# Patient Record
Sex: Female | Born: 1960 | Race: White | Hispanic: No | Smoking: Never smoker
Health system: Southern US, Community
[De-identification: ages and names within clinical notes are randomized; demographics above are authoritative.]

## PROBLEM LIST (undated history)

## (undated) DIAGNOSIS — E669 Obesity, unspecified: Principal | ICD-10-CM

## (undated) DIAGNOSIS — J302 Other seasonal allergic rhinitis: Secondary | ICD-10-CM

## (undated) DIAGNOSIS — R87629 Unspecified abnormal cytological findings in specimens from vagina: Secondary | ICD-10-CM

## (undated) DIAGNOSIS — N951 Menopausal and female climacteric states: Secondary | ICD-10-CM

## (undated) DIAGNOSIS — N950 Postmenopausal bleeding: Secondary | ICD-10-CM

## (undated) DIAGNOSIS — R87619 Unspecified abnormal cytological findings in specimens from cervix uteri: Secondary | ICD-10-CM

## (undated) DIAGNOSIS — IMO0002 Reserved for concepts with insufficient information to code with codable children: Secondary | ICD-10-CM

## (undated) HISTORY — PX: TONSILLECTOMY: SUR1361

## (undated) HISTORY — DX: Postmenopausal bleeding: N95.0

## (undated) HISTORY — DX: Unspecified abnormal cytological findings in specimens from cervix uteri: R87.619

## (undated) HISTORY — DX: Reserved for concepts with insufficient information to code with codable children: IMO0002

## (undated) HISTORY — DX: Other seasonal allergic rhinitis: J30.2

## (undated) HISTORY — DX: Obesity, unspecified: E66.9

## (undated) HISTORY — PX: TUBAL LIGATION: SHX77

## (undated) HISTORY — DX: Menopausal and female climacteric states: N95.1

## (undated) HISTORY — DX: Unspecified abnormal cytological findings in specimens from vagina: R87.629

---

## 2002-03-06 ENCOUNTER — Encounter: Payer: Self-pay | Admitting: Obstetrics & Gynecology

## 2002-03-06 ENCOUNTER — Ambulatory Visit (HOSPITAL_COMMUNITY): Admission: RE | Admit: 2002-03-06 | Discharge: 2002-03-06 | Payer: Self-pay | Admitting: Obstetrics & Gynecology

## 2004-04-11 ENCOUNTER — Ambulatory Visit (HOSPITAL_COMMUNITY): Admission: RE | Admit: 2004-04-11 | Discharge: 2004-04-11 | Payer: Self-pay | Admitting: Obstetrics and Gynecology

## 2005-10-12 ENCOUNTER — Ambulatory Visit (HOSPITAL_COMMUNITY): Admission: RE | Admit: 2005-10-12 | Discharge: 2005-10-12 | Payer: Self-pay | Admitting: Obstetrics and Gynecology

## 2006-10-21 ENCOUNTER — Ambulatory Visit (HOSPITAL_COMMUNITY): Admission: RE | Admit: 2006-10-21 | Discharge: 2006-10-21 | Payer: Self-pay | Admitting: Obstetrics and Gynecology

## 2007-04-27 ENCOUNTER — Other Ambulatory Visit: Admission: RE | Admit: 2007-04-27 | Discharge: 2007-04-27 | Payer: Self-pay | Admitting: Obstetrics and Gynecology

## 2008-04-26 ENCOUNTER — Other Ambulatory Visit: Admission: RE | Admit: 2008-04-26 | Discharge: 2008-04-26 | Payer: Self-pay | Admitting: Obstetrics and Gynecology

## 2009-04-30 ENCOUNTER — Other Ambulatory Visit: Admission: RE | Admit: 2009-04-30 | Discharge: 2009-04-30 | Payer: Self-pay | Admitting: Obstetrics & Gynecology

## 2009-05-06 ENCOUNTER — Ambulatory Visit (HOSPITAL_COMMUNITY): Admission: RE | Admit: 2009-05-06 | Discharge: 2009-05-06 | Payer: Self-pay | Admitting: Obstetrics & Gynecology

## 2010-07-06 ENCOUNTER — Encounter: Payer: Self-pay | Admitting: Obstetrics & Gynecology

## 2012-09-28 ENCOUNTER — Encounter: Payer: Self-pay | Admitting: *Deleted

## 2012-09-28 DIAGNOSIS — Z87898 Personal history of other specified conditions: Secondary | ICD-10-CM | POA: Insufficient documentation

## 2012-09-29 ENCOUNTER — Ambulatory Visit (INDEPENDENT_AMBULATORY_CARE_PROVIDER_SITE_OTHER): Payer: BC Managed Care – PPO | Admitting: Adult Health

## 2012-09-29 ENCOUNTER — Other Ambulatory Visit (HOSPITAL_COMMUNITY)
Admission: RE | Admit: 2012-09-29 | Discharge: 2012-09-29 | Disposition: A | Discharge status: Home / Self Care | Payer: BC Managed Care – PPO | Source: Ambulatory Visit | Attending: Adult Health | Admitting: Adult Health

## 2012-09-29 ENCOUNTER — Encounter: Payer: Self-pay | Admitting: Adult Health

## 2012-09-29 VITALS — BP 110/72 | HR 78 | Ht 64.5 in | Wt 204.0 lb

## 2012-09-29 DIAGNOSIS — N951 Menopausal and female climacteric states: Secondary | ICD-10-CM

## 2012-09-29 DIAGNOSIS — Z01419 Encounter for gynecological examination (general) (routine) without abnormal findings: Secondary | ICD-10-CM

## 2012-09-29 DIAGNOSIS — Z1151 Encounter for screening for human papillomavirus (HPV): Secondary | ICD-10-CM | POA: Insufficient documentation

## 2012-09-29 DIAGNOSIS — Z87898 Personal history of other specified conditions: Secondary | ICD-10-CM

## 2012-09-29 DIAGNOSIS — E669 Obesity, unspecified: Secondary | ICD-10-CM

## 2012-09-29 DIAGNOSIS — R87810 Cervical high risk human papillomavirus (HPV) DNA test positive: Secondary | ICD-10-CM | POA: Insufficient documentation

## 2012-09-29 DIAGNOSIS — Z0000 Encounter for general adult medical examination without abnormal findings: Secondary | ICD-10-CM

## 2012-09-29 DIAGNOSIS — Z8742 Personal history of other diseases of the female genital tract: Secondary | ICD-10-CM

## 2012-09-29 DIAGNOSIS — J302 Other seasonal allergic rhinitis: Secondary | ICD-10-CM

## 2012-09-29 DIAGNOSIS — J309 Allergic rhinitis, unspecified: Secondary | ICD-10-CM

## 2012-09-29 DIAGNOSIS — Z1212 Encounter for screening for malignant neoplasm of rectum: Secondary | ICD-10-CM

## 2012-09-29 HISTORY — DX: Other seasonal allergic rhinitis: J30.2

## 2012-09-29 LAB — HEMOCCULT GUIAC POC 1CARD (OFFICE): Fecal Occult Blood, POC: NEGATIVE

## 2012-09-29 NOTE — Progress Notes (Signed)
Patient ID: Andrea Tyler, female   DOB: [birth year], 51 y.o.   MRN: [id number] History of Present Illness: Andrea Tyler is a 52 year old white female in for pap and physical.   Current Medications, Allergies, Past Medical History, Past Surgical History, Family History and Social History were reviewed in [employer] electronic medical record.     Review of Systems:Patient denies any headaches, blurred vision, shortness of breath, chest pain, abdominal pain, problems with bowel movements, urination, or intercourse. No musculoskeletal problems and no mood changes, she does have allergies and complains of weight gain. 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 Periods are irregular and she is having some hot flashes.Discussed that when period has stopped for 366 days and she sees any vaginal bleeding she needs to let me know.   Physical Exam:Blood pressure 110/72, pulse 78, height 5' 4.5" (1.638 m), weight 204 lb (92.534 kg). General:  Well developed, well nourished, no acute distress Skin:  Warm and dry Neck:  Midline trachea, normal thyroid Lungs; Clear to auscultation bilaterally Breast:  No dominant palpable mass, retraction, or nipple discharge Cardiovascular: Regular rate and rhythm Abdomen:  Soft, non tender, no hepatosplenomegaly Pelvic:  External genitalia is normal in appearance.  The vagina is normal in appearance. The cervix is bulbous.  Uterus is felt to be normal size, shape, and contour.  No adnexal masses or tenderness noted. Rectal: Good sphincter tone, no polyps, or hemorrhoids felt.  Hemoccult negative. Extremities:  No swelling or varicosities noted Psych:  No mood changes, she is alert and cooperative.  Impression: Yearly exam Allergies Obesity Peri menopausal female Hx. Abnormal pap  Plan: Get mammogram now( last one was 2010) Advised colonoscopy due, referred to Dr. Darrick Penna Orders given to get fasting labs in near future(CBC,CMP,TSH, lipid profile), follow up by phone  about 3 days after drawn Decrease carbs and/or can count calories Return to clinic in 1 year physical

## 2012-09-29 NOTE — Patient Instructions (Addendum)
Get mammogram Get labs in near future Return in 1 year physical Sign up for my chart Decrease carbs1800 Calorie Diet for Diabetes Meal Planning The 1800 calorie diet is designed for eating up to 1800 calories each day. Following this diet and making healthy meal choices can help improve overall health. This diet controls blood sugar (glucose) levels and can also help lower blood pressure and cholesterol. SERVING SIZES Measuring foods and serving sizes helps to make sure you are getting the right amount of food. The list below tells how big or small some common serving sizes are:  1 oz.........4 stacked dice.  3 oz........Marland KitchenDeck of cards.  1 tsp.......Marland KitchenTip of little finger.  1 tbs......Marland KitchenMarland KitchenThumb.  2 tbs.......Marland KitchenGolf ball.   cup......Marland KitchenHalf of a fist.  1 cup.......Marland KitchenA fist. GUIDELINES FOR CHOOSING FOODS The goal of this diet is to eat a variety of foods and limit calories to 1800 each day. This can be done by choosing foods that are low in calories and fat. The diet also suggests eating small amounts of food frequently. Doing this helps control your blood glucose levels so they do not get too high or too low. Each meal or snack may include a protein food source to help you feel more satisfied and to stabilize your blood glucose. Try to eat about the same amount of food around the same time each day. This includes weekend days, travel days, and days off work. Space your meals about 4 to 5 hours apart and add a snack between them if you wish.  For example, a daily food plan could include breakfast, a morning snack, lunch, dinner, and an evening snack. Healthy meals and snacks include whole grains, vegetables, fruits, lean meats, poultry, fish, and dairy products. As you plan your meals, select a variety of foods. Choose from the bread and starch, vegetable, fruit, dairy, and meat/protein groups. Examples of foods from each group and their suggested serving sizes are listed below. Use measuring cups  and spoons to become familiar with what a healthy portion looks like. Bread and Starch Each serving equals 15 grams of carbohydrates.  1 slice bread.   bagel.   cup cold cereal (unsweetened).   cup hot cereal or mashed potatoes.  1 small potato (size of a computer mouse).   cup cooked pasta or rice.   English muffin.  1 cup broth-based soup.  3 cups of popcorn.  4 to 6 whole-wheat crackers.   cup cooked beans, peas, or corn. Vegetable Each serving equals 5 grams of carbohydrates.   cup cooked vegetables.  1 cup raw vegetables.   cup tomato or vegetable juice. Fruit Each serving equals 15 grams of carbohydrates.  1 small apple or orange.  1 cup watermelon or strawberries.   cup applesauce (no sugar added).  2 tbs raisins.   banana.   cup canned fruit, packed in water, its own juice, or sweetened with a sugar substitute.   cup unsweetened fruit juice. Dairy Each serving equals 12 to 15 grams of carbohydrates.  1 cup fat-free milk.  6 oz artificially sweetened yogurt or plain yogurt.  1 cup low-fat buttermilk.  1 cup soy milk.  1 cup almond milk. Meat/Protein  1 large egg.  2 to 3 oz meat, poultry, or fish.   cup low-fat cottage cheese.  1 tbs peanut butter.  1 oz low-fat cheese.   cup tuna in water.   cup tofu. Fat  1 tsp oil.  1 tsp trans-fat-free margarine.  1 tsp butter.  1 tsp mayonnaise.  2 tbs avocado.  1 tbs salad dressing.  1 tbs cream cheese.  2 tbs sour cream. SAMPLE 1800 CALORIE DIET PLAN Breakfast   cup unsweetened cereal (1 carb serving).  1 cup fat-free milk (1 carb serving).  1 slice whole-wheat toast (1 carb serving).   small banana (1 carb serving).  1 scrambled egg.  1 tsp trans-fat-free margarine. Lunch  Tuna sandwich.  2 slices whole-wheat bread (2 carb servings).   cup canned tuna in water, drained.  1 tbs reduced fat mayonnaise.  1 stalk celery, chopped.  2  slices tomato.  1 lettuce leaf.  1 cup carrot sticks.  24 to 30 seedless grapes (2 carb servings).  6 oz light yogurt (1 carb serving). Afternoon Snack  3 graham cracker squares (1 carb serving).  Fat-free milk, 1 cup (1 carb serving).  1 tbs peanut butter. Dinner  3 oz salmon, broiled with 1 tsp oil.  1 cup mashed potatoes (2 carb servings) with 1 tsp trans-fat-free margarine.  1 cup fresh or frozen green beans.  1 cup steamed asparagus.  1 cup fat-free milk (1 carb serving). Evening Snack  3 cups air-popped popcorn (1 carb serving).  2 tbs parmesan cheese sprinkled on top. MEAL PLAN Use this worksheet to help you make a daily meal plan based on the 1800 calorie diet suggestions. If you are using this plan to help you control your blood glucose, you may interchange carbohydrate-containing foods (dairy, starches, and fruits). Select a variety of fresh foods of varying colors and flavors. The total amount of carbohydrate in your meals or snacks is more important than making sure you include all of the food groups every time you eat. Choose from the following foods to build your day's meals:  8 Starches.  4 Vegetables.  3 Fruits.  2 Dairy.  6 to 7 oz Meat/Protein.  Up to 4 Fats. Your dietician can use this worksheet to help you decide how many servings and which types of foods are right for you. BREAKFAST Food Group and Servings / Food Choice Starch ________________________________________________________ Dairy _________________________________________________________ Fruit _________________________________________________________ Meat/Protein __________________________________________________ Fat ___________________________________________________________ LUNCH Food Group and Servings / Food Choice Starch ________________________________________________________ Meat/Protein __________________________________________________ Vegetable  _____________________________________________________ Fruit _________________________________________________________ Dairy _________________________________________________________ Fat ___________________________________________________________ Aura Fey Food Group and Servings / Food Choice Starch ________________________________________________________ Meat/Protein __________________________________________________ Fruit __________________________________________________________ Dairy _________________________________________________________ Laural Golden Food Group and Servings / Food Choice Starch _________________________________________________________ Meat/Protein ___________________________________________________ Dairy __________________________________________________________ Vegetable ______________________________________________________ Fruit ___________________________________________________________ Fat ____________________________________________________________ Lollie Sails Food Group and Servings / Food Choice Fruit __________________________________________________________ Meat/Protein ___________________________________________________ Dairy __________________________________________________________ Starch _________________________________________________________ DAILY TOTALS Starch ____________________________ Vegetable _________________________ Fruit _____________________________ Dairy _____________________________ Meat/Protein______________________ Fat _______________________________ Document Released: 12/22/2004 Document Revised: 08/24/2011 Document Reviewed: 04/17/2011 ExitCare Patient Information 2013 [place], [place].

## 2012-09-30 ENCOUNTER — Other Ambulatory Visit: Payer: Self-pay | Admitting: Adult Health

## 2012-09-30 LAB — COMPREHENSIVE METABOLIC PANEL
ALT: 29 U/L (ref 0–35)
AST: 34 U/L (ref 0–37)
Albumin: 4.1 g/dL (ref 3.5–5.2)
Alkaline Phosphatase: 78 U/L (ref 39–117)
BUN: 16 mg/dL (ref 6–23)
CO2: 28 mEq/L (ref 19–32)
Calcium: 9.3 mg/dL (ref 8.4–10.5)
Chloride: 104 mEq/L (ref 96–112)
Creat: 0.72 mg/dL (ref 0.50–1.10)
Glucose, Bld: 97 mg/dL (ref 70–99)
Potassium: 4.2 mEq/L (ref 3.5–5.3)
Sodium: 140 mEq/L (ref 135–145)
Total Bilirubin: 0.5 mg/dL (ref 0.3–1.2)
Total Protein: 6.7 g/dL (ref 6.0–8.3)

## 2012-09-30 LAB — CBC
HCT: 36.7 % (ref 36.0–46.0)
Hemoglobin: 12.9 g/dL (ref 12.0–15.0)
MCH: 31 pg (ref 26.0–34.0)
MCHC: 35.1 g/dL (ref 30.0–36.0)
MCV: 88.2 fL (ref 78.0–100.0)
Platelets: 258 10*3/uL (ref 150–400)
RBC: 4.16 MIL/uL (ref 3.87–5.11)
RDW: 13.6 % (ref 11.5–15.5)
WBC: 5.6 10*3/uL (ref 4.0–10.5)

## 2012-09-30 LAB — LIPID PANEL
Cholesterol: 252 mg/dL — ABNORMAL HIGH (ref 0–200)
HDL: 66 mg/dL (ref >39)
LDL Cholesterol: 172 mg/dL — ABNORMAL HIGH (ref 0–99)
Total CHOL/HDL Ratio: 3.8 Ratio
Triglycerides: 72 mg/dL (ref <150)
VLDL: 14 mg/dL (ref 0–40)

## 2012-09-30 LAB — TSH: TSH: 2.609 u[IU]/mL (ref 0.350–4.500)

## 2012-10-03 ENCOUNTER — Other Ambulatory Visit: Payer: Self-pay | Admitting: Obstetrics and Gynecology

## 2012-10-03 ENCOUNTER — Telehealth: Payer: Self-pay | Admitting: Adult Health

## 2012-10-03 DIAGNOSIS — Z139 Encounter for screening, unspecified: Secondary | ICD-10-CM

## 2012-10-03 MED ORDER — PHENTERMINE HCL 37.5 MG PO CAPS: 37.5000 mg | ORAL_CAPSULE | ORAL | Status: DC

## 2012-10-03 NOTE — Telephone Encounter (Signed)
Called pt. To review labs, CBC,CMP, TSH are normal. Lipid profile TC: 252, trig:72. HDL:66, ratio 3.8, LDL 172, increase exercise try weight loss. Pt request adipex. Will call in x 1 month then needs follow up.

## 2012-10-05 ENCOUNTER — Telehealth: Payer: Self-pay | Admitting: Adult Health

## 2012-10-05 NOTE — Telephone Encounter (Signed)
Pt aware pap normal but + for HPV will repeat pap next year.

## 2012-10-10 ENCOUNTER — Ambulatory Visit (HOSPITAL_COMMUNITY)
Admission: RE | Admit: 2012-10-10 | Discharge: 2012-10-10 | Disposition: A | Discharge status: Home / Self Care | Payer: BC Managed Care – PPO | Source: Ambulatory Visit | Attending: Obstetrics and Gynecology | Admitting: Obstetrics and Gynecology

## 2012-10-10 DIAGNOSIS — Z1231 Encounter for screening mammogram for malignant neoplasm of breast: Secondary | ICD-10-CM | POA: Insufficient documentation

## 2012-10-10 DIAGNOSIS — Z139 Encounter for screening, unspecified: Secondary | ICD-10-CM

## 2012-11-04 ENCOUNTER — Ambulatory Visit (INDEPENDENT_AMBULATORY_CARE_PROVIDER_SITE_OTHER): Payer: BC Managed Care – PPO | Admitting: Adult Health

## 2012-11-04 ENCOUNTER — Encounter: Payer: Self-pay | Admitting: Adult Health

## 2012-11-04 VITALS — BP 112/80 | Ht 64.5 in | Wt 192.0 lb

## 2012-11-04 DIAGNOSIS — E669 Obesity, unspecified: Secondary | ICD-10-CM

## 2012-11-04 DIAGNOSIS — N951 Menopausal and female climacteric states: Secondary | ICD-10-CM

## 2012-11-04 HISTORY — DX: Obesity, unspecified: E66.9

## 2012-11-04 HISTORY — DX: Menopausal and female climacteric states: N95.1

## 2012-11-04 MED ORDER — PHENTERMINE HCL 37.5 MG PO CAPS: 37.5000 mg | ORAL_CAPSULE | ORAL | Status: DC

## 2012-11-04 NOTE — Progress Notes (Signed)
Subjective:     Patient ID: Andrea Tyler, female   DOB: 11-Apr-1961, 52 y.o.   MRN: [id number]  HPI Andrea Tyler is back for a weight check, she lost 12 lbs since last visit,and we will continue with the adipex and weight loss efforts.  Review of Systems No complaints except some hot flashes and waking up about 3 pm Reviewed past medical,surgical, social and family history. Reviewed medications and allergies.     Objective:   Physical Exam Blood pressure 112/80, height 5' 4.5" (1.638 m), weight 192 lb (87.091 kg).   Skin warm and dry. Lungs: clear to ausculation bilaterally. Cardiovascular: regular rate and rhythm. Discussed menopausal symptoms, and treatment options, will follow for now. Assessment:      Obesity Weight management Menopausal symptoms     Plan:      Rx adipex 37.5 mg 1 daily #30 with no refills Follow up in 4 weeks for BP and weight check

## 2012-11-04 NOTE — Assessment & Plan Note (Signed)
Lost 12 lbs with adipex will continue for now

## 2012-11-04 NOTE — Patient Instructions (Addendum)
Continue adipex and weight loss effort Follow up in 4 weeks  

## 2012-12-05 ENCOUNTER — Ambulatory Visit (INDEPENDENT_AMBULATORY_CARE_PROVIDER_SITE_OTHER): Payer: BC Managed Care – PPO | Admitting: Adult Health

## 2012-12-05 ENCOUNTER — Encounter: Payer: Self-pay | Admitting: Adult Health

## 2012-12-05 VITALS — BP 120/78 | Ht 64.5 in | Wt 187.0 lb

## 2012-12-05 DIAGNOSIS — E669 Obesity, unspecified: Secondary | ICD-10-CM

## 2012-12-05 MED ORDER — PHENTERMINE HCL 37.5 MG PO CAPS: 37.5000 mg | ORAL_CAPSULE | ORAL | Status: DC

## 2012-12-05 NOTE — Progress Notes (Signed)
Subjective:     Patient ID: Andrea Tyler, female   DOB: [birth year], 52 y.o.   MRN: [id number]  HPI Andrea Tyler is back for a weight check and BP check, she has lost a total of 17 pounds and 5 inches since April 17th,she wants to continue the adipex and weight loss efforts.  Review of Systems No complaints Reviewed past medical,surgical, social and family history. Reviewed medications and allergies.     Objective:   Physical Exam BP 120/78  Ht 5' 4.5" (1.638 m)  Wt 187 lb (84.823 kg)  BMI 31.61 kg/m2   Skin warm and dry. Neck: mid line trachea, normal thyroid. Lungs: clear to ausculation bilaterally. Cardiovascular: regular rate and rhythm. Assessment:     Obesity Weight management    Plan:      Rx Adipex 37.5 mg #30 1 daily no refills Follow up in 4 weeks

## 2012-12-05 NOTE — Patient Instructions (Addendum)
Continue weight loss efforts Follow up in 4 weeks 

## 2013-01-02 ENCOUNTER — Encounter: Payer: Self-pay | Admitting: Adult Health

## 2013-01-02 ENCOUNTER — Ambulatory Visit (INDEPENDENT_AMBULATORY_CARE_PROVIDER_SITE_OTHER): Payer: BC Managed Care – PPO | Admitting: Adult Health

## 2013-01-02 VITALS — BP 120/80 | Ht 64.5 in | Wt 190.0 lb

## 2013-01-02 DIAGNOSIS — N951 Menopausal and female climacteric states: Secondary | ICD-10-CM

## 2013-01-02 DIAGNOSIS — E669 Obesity, unspecified: Secondary | ICD-10-CM

## 2013-01-02 NOTE — Patient Instructions (Addendum)
Follow up prn Try benadryl at hs Call if increase in hot flashes

## 2013-01-02 NOTE — Progress Notes (Signed)
Subjective:     Patient ID: Andrea Tyler, female   DOB: Jun 02, 1961, 52 y.o.   MRN: [id number]  HPI Andrea Tyler is back for weight check and complains of not sleeping as well and some hot flashes.  Review of Systems Positives in HPI Reviewed past medical,surgical, social and family history. Reviewed medications and allergies.     Objective:   Physical Exam BP 120/80  Ht 5' 4.5" (1.638 m)  Wt 190 lb (86.183 kg)  BMI 32.12 kg/m2 Skin warm and dry. Neck: mid line trachea, normal thyroid. Lungs: clear to ausculation bilaterally. Cardiovascular: regular rate and rhythm.   Will not refill adipex for now, she will continue to try to lose weight on her own.She has some family stress, but she doing OK.she does not sleep as well and has some hot flashes.  Assessment:      Obesity Menopausal symptoms    Plan:      Try benadryl 25 mg 1 at hs and call if hot flashes increase, discussed HRt and she does not want that yet Follow up prn

## 2013-02-27 ENCOUNTER — Ambulatory Visit: Payer: BC Managed Care – PPO | Admitting: Adult Health

## 2013-04-12 ENCOUNTER — Ambulatory Visit (HOSPITAL_COMMUNITY)
Admission: RE | Admit: 2013-04-12 | Discharge: 2013-04-12 | Disposition: A | Discharge status: Home / Self Care | Payer: BC Managed Care – PPO | Source: Ambulatory Visit | Attending: Family Medicine | Admitting: Family Medicine

## 2013-04-12 DIAGNOSIS — M6281 Muscle weakness (generalized): Secondary | ICD-10-CM | POA: Insufficient documentation

## 2013-04-12 DIAGNOSIS — M25622 Stiffness of left elbow, not elsewhere classified: Secondary | ICD-10-CM

## 2013-04-12 DIAGNOSIS — S52122A Displaced fracture of head of left radius, initial encounter for closed fracture: Secondary | ICD-10-CM

## 2013-04-12 DIAGNOSIS — M25529 Pain in unspecified elbow: Secondary | ICD-10-CM | POA: Insufficient documentation

## 2013-04-12 DIAGNOSIS — IMO0001 Reserved for inherently not codable concepts without codable children: Secondary | ICD-10-CM | POA: Insufficient documentation

## 2013-04-12 DIAGNOSIS — M25629 Stiffness of unspecified elbow, not elsewhere classified: Secondary | ICD-10-CM | POA: Insufficient documentation

## 2013-04-12 DIAGNOSIS — M25519 Pain in unspecified shoulder: Secondary | ICD-10-CM | POA: Insufficient documentation

## 2013-04-12 NOTE — Evaluation (Addendum)
Physical Therapy Evaluation  Patient Details  Name: Andrea Tyler MRN: [id number] Date of Birth: [birth year]  Today's Date: 04/12/2013 Time: 0932-1015 PT Time Calculation (min): 43 min Charges: 1 evaluation              Visit#: 1 of 8  Re-eval: 05/12/13 Assessment Diagnosis: Lt radial head fractures Surgical Date: 02/18/13 Next MD Visit: Dr. Rodolph Tyler -November 20th [facility])  Past Medical History:  Past Medical History  Diagnosis Date  . Abnormal Pap smear   . Seasonal allergies 09/29/2012  . Obesity 11/04/2012  . Menopausal symptoms 11/04/2012  . Menopausal symptom 11/04/2012   Past Surgical History:  Past Surgical History  Procedure Laterality Date  . Tubal ligation    . Tonsillectomy    . Cesarean section      Subjective Symptoms/Limitations Pertinent History: Pt is a 52 year old female referred to PT for Lt elbow ROM.  Pt reports that on Sept 6th she feel out of bed.  She went to an urgent care who found she had a radial head fracture to her elbow and was placed in a sling for a week and then went to [facility] orthopedic who took the cast off and placed her in a sling for 6 weeks.  She reports that the MD encouraged her for gentle AROM activities while she was in the slign. 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 : Yes Is the patient reluctant to leave their home because of a fear of falling? 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   Sensation/Coordination/Flexibility/Functional Tests Functional Tests Functional Tests: FOTO: Status: 65%, Limiation: 35%  Assessment LUE AROM (degrees) Left Elbow Flexion: 125 Left Elbow Extension: -20 LUE PROM (degrees) Left Elbow Flexion: 150 Left Elbow Extension: 0 Left Forearm Pronation: 80 Degrees Left Forearm Supination: 80 Degrees LUE Strength Left Shoulder Flexion: 4/5 Left Shoulder Extension: 4/5 Left Shoulder ABduction: 4/5 Left Elbow Flexion: 3+/5 Left Elbow Extension: 3+/5 Palpation Palpation: increased pain and tenderness with maximal fascial restrictions and  muscle spasms to Lt upper trapezius, Lt biceps, triceps,   Exercise/Treatments ROM / Strengthening / Isometric Strengthening Other ROM/Strengthening Exercises: Supine: Elbow extension hang qw/1/2 lb weight x3 minutes; Elbow flexion hang 2 minutes w/1 lb weight w/PROM after in both directins    Physical Therapy Assessment and Plan PT Assessment and Plan Clinical Impression Statement: Pt is a 52 year old right handed female referred to PT s/p Lt radial head fracture with impairments listed below.  Pt will benefit from skilled therapeutic intervention in order to improve on the following deficits: Decreased strength;Decreased range of motion;Impaired UE functional use;Increased muscle spasms Rehab Potential: Good PT Frequency: Min 2X/week PT Duration: 4 weeks PT Treatment/Interventions: Therapeutic activities;Therapeutic exercise;Patient/family education;Manual techniques PT Plan: continue with manual techniques and joint mobs  to Lt shoulder musculature and elbow to improve functional elbow flexion and extension, add forearm stretching  exercises, bicep/tricep strengthening.  Progres to UBE and shoulder theraband exercises.   F/U on referral.  Called MD office and pt states she will bring it to  therapy  Goals Home Exercise Program Pt/caregiver will Perform Home Exercise Program: Independently PT Goal: Perform Home Exercise Program - Progress: Goal set today PT Short Term Goals Time to Complete Short Term Goals: 2 weeks PT Short Term Goal 1: Pt will improve Lt elbow AROM flexion 10-140 degrees for improved mobility with grooming activities.  PT Short Term Goal 2: Pt will improve her Lt shoulder and elbow strength by 1 muscle grade for greater ease with cleaning duties for her work.  PT Short Term Goal 3: Pt will present with minimal muscle spasms and fascial restrictions throughout the LUE for improved QOL.  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  PT Long Term Goal 2: Pt will improve LUE coordination to decrease risk of muscle spasms and secondary injury to her shoulder.  Long Term Goal 3: Pt will improve her FOTO score to status greater than 68% and limiatiaon less than 32%.  Problem List Patient Active Problem List   Diagnosis Date Noted  . Obesity 11/04/2012  . Menopausal symptoms 11/04/2012  . Seasonal allergies 09/29/2012  . Hx of abnormal Pap smear 09/28/2012    PT Plan of Care PT Home Exercise Plan: given PT Patient Instructions: importance of self stretching for up to 5 minutes.  Consulted and Agree with Plan of Care: Patient  Annett Fabian, MPT, ATC 04/12/2013, 2:17 PM  Physician Documentation Your signature is required to indicate approval of the treatment plan as stated above.  Please sign and either send electronically or make a copy of this report for your files and return this physician signed original.   Please mark one 1.__approve of plan  2. ___approve of plan with the following conditions.   ______________________________                                                           _____________________ Physician Signature                                                                                                             Date

## 2013-04-17 ENCOUNTER — Ambulatory Visit (HOSPITAL_COMMUNITY)
Admission: RE | Admit: 2013-04-17 | Discharge: 2013-04-17 | Disposition: A | Discharge status: Home / Self Care | Payer: BC Managed Care – PPO | Source: Ambulatory Visit | Attending: Family Medicine | Admitting: Family Medicine

## 2013-04-17 DIAGNOSIS — S52122A Displaced fracture of head of left radius, initial encounter for closed fracture: Secondary | ICD-10-CM

## 2013-04-17 DIAGNOSIS — M6281 Muscle weakness (generalized): Secondary | ICD-10-CM | POA: Insufficient documentation

## 2013-04-17 DIAGNOSIS — IMO0001 Reserved for inherently not codable concepts without codable children: Secondary | ICD-10-CM | POA: Insufficient documentation

## 2013-04-17 DIAGNOSIS — M25629 Stiffness of unspecified elbow, not elsewhere classified: Secondary | ICD-10-CM | POA: Insufficient documentation

## 2013-04-17 DIAGNOSIS — M25622 Stiffness of left elbow, not elsewhere classified: Secondary | ICD-10-CM

## 2013-04-17 DIAGNOSIS — M25519 Pain in unspecified shoulder: Secondary | ICD-10-CM | POA: Insufficient documentation

## 2013-04-17 DIAGNOSIS — M25529 Pain in unspecified elbow: Secondary | ICD-10-CM | POA: Insufficient documentation

## 2013-04-17 NOTE — Progress Notes (Signed)
Physical Therapy Treatment Patient Details  Name: BETZAIRA MENTEL MRN: [id number] Date of Birth: 1961-02-05  Today's Date: 04/17/2013 Time: 1020-1100 PT Time Calculation (min): 40 min Charge: TE 1020-1050, Manual 1050-1100  Visit#: 2 of 8  Re-eval: 05/12/13 Assessment Diagnosis: Lt radial head fractures Surgical Date: 02/18/13 Next MD Visit: Dr. Scott Kendall-November 20th  Subjective: Symptoms/Limitations Symptoms: Pt reports complaince with HEP and able to demonstrate appropriate technique with exercises. Pain Assessment Currently in Pain?: No/denies  Objective:   Exercise/Treatments Seated Extension: Limitations;10 reps;Weights Extension Weight (lbs): 2 Extension Limitations: triceps extension Flexion: Weights;10 reps;Limitations Flexion Weight (lbs): 2 Flexion Limitations: biceps curls  Other Seated Exercises: tricep st 3x 30" Other Seated Exercises: Grip strengthening with yellow putty... flattening, pinching, rolling, finding 5 beads ROM / Strengthening / Isometric Strengthening Other ROM/Strengthening Exercises: Supine: Elbow extension hang w/2 lb weight x5 minutes; Elbow flexion hang 2 minutes w/2 lb weight w/PROM after in both directins Other ROM/Strengthening Exercises: Contract relax 3 reps 10 sec holds; forearm flexion and extension st 3x 30"  Manual Therapy Manual Therapy: Joint mobilization Joint Mobilization: Radicular joint mobs   Physical Therapy Assessment and Plan PT Assessment and Plan Clinical Impression Statement: Began POC for elbow flexion and extension ROM with manual techniques, instructed stretches to assist with flexibilty and began elbow and grip strengthening exercises.  Pt given theraputty to improve grip strengthening with HEP.  No reports of pain through session.   PT Plan: continue with manual techniques and joint mobs  to Lt shoulder musculature and elbow to improve functional elbow flexion and extension, add forearm stretching exercises,  bicep/tricep strengthening.  Progres to UBE and shoulder theraband exercises.     Goals Home Exercise Program Pt/caregiver will Perform Home Exercise Program: Independently PT Short Term Goals Time to Complete Short Term Goals: 2 weeks PT Short Term Goal 1: Pt will improve Lt elbow AROM flexion 10-140 degrees for improved mobility with grooming activities.  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  PT Short Term Goal 2 - Progress: Progressing toward goal PT Short Term Goal 3: Pt will present with minimal muscle spasms and fascial restrictions throughout the LUE for improved QOL.  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  PT Long Term Goal 2: Pt will improve LUE coordination to decrease risk of muscle spasms and secondary injury to her shoulder.  Long Term Goal 3: Pt will improve her FOTO score to status greater than 68% and limiatiaon less than 32%.  Problem List Patient Active Problem List   Diagnosis Date Noted  . Fracture of radial head, left, closed 04/12/2013  . Stiffness of left elbow joint 04/12/2013  . Obesity 11/04/2012  . Menopausal symptoms 11/04/2012  . Seasonal allergies 09/29/2012  . Hx of abnormal Pap smear 09/28/2012    PT - End of Session Activity Tolerance: Patient tolerated treatment well General Behavior During Therapy: [facility] for tasks assessed/performed  GP    Juel Burrow 04/17/2013, 11:24 AM

## 2013-04-20 ENCOUNTER — Ambulatory Visit (HOSPITAL_COMMUNITY)
Admission: RE | Admit: 2013-04-20 | Discharge: 2013-04-20 | Disposition: A | Discharge status: Home / Self Care | Payer: BC Managed Care – PPO | Source: Ambulatory Visit | Attending: Family Medicine | Admitting: Family Medicine

## 2013-04-20 ENCOUNTER — Other Ambulatory Visit: Payer: Self-pay

## 2013-04-20 DIAGNOSIS — M25622 Stiffness of left elbow, not elsewhere classified: Secondary | ICD-10-CM

## 2013-04-20 DIAGNOSIS — S52122A Displaced fracture of head of left radius, initial encounter for closed fracture: Secondary | ICD-10-CM

## 2013-04-20 NOTE — Progress Notes (Signed)
Physical Therapy Treatment Patient Details  Name: Andrea Tyler MRN: [id number] Date of Birth: [birth year]  Today's Date: 04/20/2013 Time: 1020-1102 PT Time Calculation (min): 42 min Charge: Manual 1020-1034, TE [id number]  Visit#: 3 of 8  Re-eval: 05/12/13 Assessment Diagnosis: Lt radial head fractures Surgical Date: 02/18/13 Next MD Visit: Dr. Scott Kendall-November 20th  Subjective: Symptoms/Limitations Symptoms: Pt reports elbow feeling better, reports easier to extend than to flex. Pain Assessment Currently in Pain?: No/denies  Objective:  Exercise/Treatments Seated Extension: Limitations;10 reps;Weights Extension Weight (lbs): 2 Extension Limitations: triceps extension Flexion: Weights;10 reps;Limitations Flexion Weight (lbs): 2 Flexion Limitations: biceps curls  Other Seated Exercises: tricep st 3x 30" ROM / Strengthening / Isometric Strengthening UBE (Upper Arm Bike): 6' @ 1.0 seat 13 forward with elbow extension Other ROM/Strengthening Exercises: Supine: Elbow extension hang w/2 lb weight x5 minutes; Elbow flexion hang 2 minutes w/2 lb weight w/PROM after in both directins Other ROM/Strengthening Exercises: Contract relax 5 reps 10 sec holds; forearm flexion and extension st 3x 30"    Manual Therapy Manual Therapy: Massage Massage: MFR f/b Soft tissue massage to forearm flexor musculature and biceps brachii  Physical Therapy Assessment and Plan PT Assessment and Plan Clinical Impression Statement: Manual techniques complete to reduce fascial restrictions and PROM complete to improve elbow flexion and extension.  Added exercises for strengthening and to improve AROM.  Continued with forearm stretches to improve flexibilty and increase ROM.  No reports of increased pain through session.   PT Plan: continue with manual techniques and joint mobs  to Lt shoulder musculature and elbow to improve functional elbow flexion and extension, add forearm stretching exercises,  bicep/tricep strengthening.  Progres to shoulder theraband exercises. Begin planks next session.      Goals Home Exercise Program Pt/caregiver will Perform Home Exercise Program: Independently PT Short Term Goals Time to Complete Short Term Goals: 2 weeks PT Short Term Goal 1: Pt will improve Lt elbow AROM flexion 10-140 degrees for improved mobility with grooming activities.  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  PT Short Term Goal 2 - Progress: Progressing toward goal PT Short Term Goal 3: Pt will present with minimal muscle spasms and fascial restrictions throughout the LUE for improved QOL.  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  PT Long Term Goal 2: Pt will improve LUE coordination to decrease risk of muscle spasms and secondary injury to her shoulder.  Long Term Goal 3: Pt will improve her FOTO score to status greater than 68% and limiatiaon less than 32%.  Problem List Patient Active Problem List   Diagnosis Date Noted  . Fracture of radial head, left, closed 04/12/2013  . Stiffness of left elbow joint 04/12/2013  . Obesity 11/04/2012  . Menopausal symptoms 11/04/2012  . Seasonal allergies 09/29/2012  . Hx of abnormal Pap smear 09/28/2012    PT - End of Session Activity Tolerance: Patient tolerated treatment well General Behavior During Therapy: [facility] for tasks assessed/performed  GP    Juel Burrow 04/20/2013, 12:59 PM

## 2013-10-04 ENCOUNTER — Ambulatory Visit (INDEPENDENT_AMBULATORY_CARE_PROVIDER_SITE_OTHER): Payer: Medicaid Other | Admitting: Adult Health

## 2013-10-04 ENCOUNTER — Encounter: Payer: Self-pay | Admitting: Adult Health

## 2013-10-04 ENCOUNTER — Other Ambulatory Visit (HOSPITAL_COMMUNITY)
Admission: RE | Admit: 2013-10-04 | Discharge: 2013-10-04 | Disposition: A | Discharge status: Home / Self Care | Payer: Medicaid Other | Source: Ambulatory Visit | Attending: Adult Health | Admitting: Adult Health

## 2013-10-04 VITALS — BP 120/70 | HR 74 | Ht 64.5 in | Wt 201.0 lb

## 2013-10-04 DIAGNOSIS — Z139 Encounter for screening, unspecified: Secondary | ICD-10-CM

## 2013-10-04 DIAGNOSIS — Z01419 Encounter for gynecological examination (general) (routine) without abnormal findings: Secondary | ICD-10-CM

## 2013-10-04 DIAGNOSIS — Z0000 Encounter for general adult medical examination without abnormal findings: Secondary | ICD-10-CM

## 2013-10-04 DIAGNOSIS — Z1212 Encounter for screening for malignant neoplasm of rectum: Secondary | ICD-10-CM

## 2013-10-04 DIAGNOSIS — Z1151 Encounter for screening for human papillomavirus (HPV): Secondary | ICD-10-CM | POA: Insufficient documentation

## 2013-10-04 DIAGNOSIS — IMO0002 Reserved for concepts with insufficient information to code with codable children: Secondary | ICD-10-CM

## 2013-10-04 LAB — HEMOCCULT GUIAC POC 1CARD (OFFICE): Fecal Occult Blood, POC: NEGATIVE

## 2013-10-04 NOTE — Patient Instructions (Signed)
Physical in 1 year Mammogram yearly MAY Colonoscopy advised refer to Dr Oneida Alar

## 2013-10-04 NOTE — Progress Notes (Signed)
Patient ID: Andrea Tyler, female   DOB: 05/12/1961, 53 y.o.   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   Current Medications, Allergies, Past Medical History, Past Surgical History, Family History and Social History were reviewed in [employer] record.     Review of Systems: Patient denies any headaches, blurred vision, shortness of breath, chest pain, abdominal pain, problems with bowel movements, urination, or intercourse. No joint pain or mood swings.    Physical Exam:BP 120/70  Pulse 74  Ht 5' 4.5" (1.638 m)  Wt 201 lb (91.173 kg)  BMI 33.98 kg/m2 General:  Well developed, well nourished, no acute distress Skin:  Warm and dry Neck:  Midline trachea, normal thyroid Lungs; Clear to auscultation bilaterally Breast:  No dominant palpable mass, retraction, or nipple discharge Cardiovascular: Regular rate and rhythm Abdomen:  Soft, non tender, no hepatosplenomegaly Pelvic:  External genitalia is normal in appearance.  The vagina is normal in appearance. The cervix is bulbous.Pap with HPV performed.  Uterus is felt to be normal size, shape, and contour.  No                adnexal masses or tenderness noted. Rectal: Good sphincter tone, no polyps, or hemorrhoids felt.  Hemoccult negative. Extremities:  No swelling or varicosities noted Psych:  No mood changes, alert and cooperative, seems happy   Impression: Yearly gyn exam History +HPV    Plan: Physical in 1 year Refer to Dr Oneida Alar for colonoscopy Mammogram yearly

## 2013-10-13 ENCOUNTER — Telehealth: Payer: Self-pay

## 2013-10-13 NOTE — Telephone Encounter (Signed)
Pt would not give no more information states wants to speak to Derrek Monaco, NP. Pt aware Anderson Malta out of office until Monday.

## 2013-10-16 NOTE — Telephone Encounter (Signed)
Pt asking abut weight loss meds will make appt

## 2013-10-23 ENCOUNTER — Encounter: Payer: Self-pay | Admitting: Adult Health

## 2013-10-23 ENCOUNTER — Ambulatory Visit (INDEPENDENT_AMBULATORY_CARE_PROVIDER_SITE_OTHER): Payer: Medicaid Other | Admitting: Adult Health

## 2013-10-23 VITALS — BP 120/80 | Ht 64.5 in | Wt 204.0 lb

## 2013-10-23 DIAGNOSIS — E669 Obesity, unspecified: Secondary | ICD-10-CM

## 2013-10-23 MED ORDER — PHENTERMINE HCL 37.5 MG PO CAPS: 37.5000 mg | ORAL_CAPSULE | ORAL | Status: DC

## 2013-10-23 NOTE — Patient Instructions (Signed)
Calorie Counting Diet A calorie counting diet requires you to eat the number of calories that are right for you in a day. Calories are the measurement of how much energy you get from the food you eat. Eating the right amount of calories is important for staying at a healthy weight. If you eat too many calories, your body will store them as fat and you may gain weight. If you eat too few calories, you may lose weight. Counting the number of calories you eat during a day will help you know if you are eating the right amount. A Registered Dietitian can determine how many calories you need in a day. The amount of calories needed varies from person to person. If your goal is to lose weight, you will need to eat fewer calories. Losing weight can benefit you if you are overweight or have health problems such as heart disease, high blood pressure, or diabetes. If your goal is to gain weight, you will need to eat more calories. Gaining weight may be necessary if you have a certain health problem that causes your body to need more energy. TIPS Whether you are increasing or decreasing the number of calories you eat during a day, it may be hard to get used to changes in what you eat and drink. The following are tips to help you keep track of the number of calories you eat.  Measure foods at home with measuring cups. This helps you know the amount of food and number of calories you are eating.  Restaurants often serve food in amounts that are larger than 1 serving. While eating out, estimate how many servings of a food you are given. For example, a serving of cooked rice is  cup or about the size of half of a fist. Knowing serving sizes will help you be aware of how much food you are eating at restaurants.  Ask for smaller portion sizes or child-size portions at restaurants.  Plan to eat half of a meal at a restaurant. Take the rest home or share the other half with a friend.  Read the Nutrition Facts panel on  food labels for calorie content and serving size. You can find out how many servings are in a package, the size of a serving, and the number of calories each serving has.  For example, a package might contain 3 cookies. The Nutrition Facts panel on that package says that 1 serving is 1 cookie. Below that, it will say there are 3 servings in the container. The calories section of the Nutrition Facts label says there are 90 calories. This means there are 90 calories in 1 cookie (1 serving). If you eat 1 cookie you have eaten 90 calories. If you eat all 3 cookies, you have eaten 270 calories (3 servings x 90 calories = 270 calories). The list below tells you how big or small some common portion sizes are.  1 oz.........4 stacked dice.  3 oz.........Deck of cards.  1 tsp........Tip of little finger.  1 tbs........Thumb.  2 tbs........Golf ball.   cup.......Half of a fist.  1 cup........A fist. KEEP A FOOD LOG Write down every food item you eat, the amount you eat, and the number of calories in each food you eat during the day. At the end of the day, you can add up the total number of calories you have eaten. It may help to keep a list like the one below. Find out the calorie information by reading the   Nutrition Facts panel on food labels. Breakfast  Bran cereal (1 cup, 110 calories).  Fat-free milk ( cup, 45 calories). Snack  Apple (1 medium, 80 calories). Lunch  Spinach (1 cup, 20 calories).  Tomato ( medium, 20 calories).  Chicken breast strips (3 oz, 165 calories).  Shredded cheddar cheese ( cup, 110 calories).  Light [place] dressing (2 tbs, 60 calories).  Whole-wheat bread (1 slice, 80 calories).  Tub margarine (1 tsp, 35 calories).  Vegetable soup (1 cup, 160 calories). Dinner  Pork chop (3 oz, 190 calories).  Brown rice (1 cup, 215 calories).  Steamed broccoli ( cup, 20 calories).  Strawberries (1  cup, 65 calories).  Whipped cream (1 tbs, 50  calories). Daily Calorie Total: 8938 Document Released: 06/01/2005 Document Revised: 08/24/2011 Document Reviewed: 11/26/2006 [facility] Patient Information 2014 [place]. Try adipex  Follow up in  4weeks

## 2013-10-23 NOTE — Progress Notes (Signed)
Subjective:     Patient ID: Andrea Tyler, female   DOB: [birth year], 53 y.o.   MRN: [id number]  HPI Andrea Tyler is a 53 year old white female in for weight management.  Review of Systems See HPI Reviewed past medical,surgical, social and family history. Reviewed medications and allergies.     Objective:   Physical Exam BP 120/80  Ht 5' 4.5" (1.638 m)  Wt 204 lb (92.534 kg)  BMI 34.49 kg/m2   Skin warm and dry.  Lungs: clear to ausculation bilaterally. Cardiovascular: regular rate and rhythm. Assessment:     Obesity    Plan:     Rx adipex 37.5 mg #30 1 daily no refills Follow up in  4 weeks for weight check and BP check   Review handout on weight loss

## 2013-11-13 ENCOUNTER — Encounter (HOSPITAL_COMMUNITY): Payer: Self-pay | Admitting: Pharmacy Technician

## 2013-11-13 ENCOUNTER — Other Ambulatory Visit: Payer: Self-pay

## 2013-11-13 ENCOUNTER — Telehealth: Payer: Self-pay | Admitting: *Deleted

## 2013-11-13 DIAGNOSIS — Z1211 Encounter for screening for malignant neoplasm of colon: Secondary | ICD-10-CM

## 2013-11-13 NOTE — Telephone Encounter (Signed)
Gastroenterology Pre-Procedure Review  Request Date: 11/13/2013 Requesting Physician: Derrek Monaco, NP  PATIENT REVIEW QUESTIONS: The patient responded to the following health history questions as indicated:    1. Diabetes Melitis: no 2. Joint replacements in the past 12 months: no 3. Major health problems in the past 3 months: no 4. Has an artificial valve or MVP: no 5. Has a defibrillator: no 6. Has been advised in past to take antibiotics in advance of a procedure like teeth cleaning: no    MEDICATIONS & ALLERGIES:    Patient reports the following regarding taking any blood thinners:   Plavix? no Aspirin? no Coumadin? no  Patient confirms/reports the following medications:  Current Outpatient Prescriptions  Medication Sig Dispense Refill  . Cetirizine HCl (ZYRTEC PO) Take 1 tablet by mouth daily.      . fluticasone (FLONASE) 50 MCG/ACT nasal spray Place 2 sprays into both nostrils daily.      . Multiple Vitamin (MULTIVITAMIN) tablet Take 1 tablet by mouth daily.      . phentermine 37.5 MG capsule Take 1 capsule (37.5 mg total) by mouth every morning.  30 capsule  0   No current facility-administered medications for this visit.    Patient confirms/reports the following allergies:  No Known Allergies  No orders of the defined types were placed in this encounter.    AUTHORIZATION INFORMATION Primary Insurance:   ID #:   Group #:  Pre-Cert / Auth required:  Pre-Cert / Auth #:   Secondary Insurance:   ID #:   Group #:  Pre-Cert / Auth required:  Pre-Cert / Auth #:   SCHEDULE INFORMATION: Procedure has been scheduled as follows:  Date:11/21/2013                     Time: 12:15 PM  Location: [facility] Short Stay  This Gastroenterology Pre-Precedure Review Form is being routed to the following provider(s): Barney Drain, MD

## 2013-11-13 NOTE — Telephone Encounter (Signed)
Pt called wanting to schedule a colonoscopy. Pt said [facility] sent a referral. Please advise [phone]

## 2013-11-14 NOTE — Telephone Encounter (Signed)
PREPOPIK-DRINK WATER TO KEEP URINE LIGHT YELLOW. FULL LIQUIDS WITH BREAKFAST.  PT SHOULD DROP OFF RX 3 DAYS PRIOR TO PROCEDURE. 

## 2013-11-16 MED ORDER — SOD PICOSULFATE-MAG OX-CIT ACD 10-3.5-12 MG-GM-GM PO PACK: 1.0000 | PACK | ORAL | Status: DC

## 2013-11-16 NOTE — Addendum Note (Signed)
Addended by: Everardo All on: 11/16/2013 10:48 AM   Modules accepted: Orders

## 2013-11-20 ENCOUNTER — Encounter: Payer: Self-pay | Admitting: Adult Health

## 2013-11-20 ENCOUNTER — Ambulatory Visit (INDEPENDENT_AMBULATORY_CARE_PROVIDER_SITE_OTHER): Payer: Medicaid Other | Admitting: Adult Health

## 2013-11-20 VITALS — BP 112/76 | Ht 64.5 in | Wt 196.0 lb

## 2013-11-20 DIAGNOSIS — E669 Obesity, unspecified: Secondary | ICD-10-CM

## 2013-11-20 MED ORDER — PHENTERMINE HCL 37.5 MG PO CAPS: 37.5000 mg | ORAL_CAPSULE | ORAL | Status: DC

## 2013-11-20 NOTE — Patient Instructions (Signed)
Continue weight loss efforts.

## 2013-11-20 NOTE — Progress Notes (Signed)
Subjective:     Patient ID: Andrea Tyler, female   DOB: August 07, 1960, 53 y.o.   MRN: [id number]  HPI Andrea Tyler is a 53 year old white female in for weight check and BP check, on adipex.Has no complaints, is getting colonoscopy tomorrow.  Review of Systems See HPI Reviewed past medical,surgical, social and family history. Reviewed medications and allergies.     Objective:   Physical Exam BP 112/76  Ht 5' 4.5" (1.638 m)  Wt 196 lb (88.905 kg)  BMI 33.14 kg/m2Has lost 8  pounds.   Skin warm and dry.  Lungs: clear to ausculation bilaterally. Cardiovascular: regular rate and rhythm.Prasied over her efforts.  Assessment:    Obesity Weight loss management    Plan:     Continue weight loss efforts Refilled adipex 37.5 mg #30 1 daily Follow up in 4 weeks

## 2013-11-21 ENCOUNTER — Encounter (HOSPITAL_COMMUNITY)
Admission: RE | Disposition: A | Discharge status: Home / Self Care | Payer: Self-pay | Source: Ambulatory Visit | Attending: Gastroenterology

## 2013-11-21 ENCOUNTER — Encounter (HOSPITAL_COMMUNITY): Payer: Self-pay | Admitting: *Deleted

## 2013-11-21 ENCOUNTER — Ambulatory Visit (HOSPITAL_COMMUNITY)
Admission: RE | Admit: 2013-11-21 | Discharge: 2013-11-21 | Disposition: A | Discharge status: Home / Self Care | Payer: Medicaid Other | Source: Ambulatory Visit | Attending: Gastroenterology | Admitting: Gastroenterology

## 2013-11-21 DIAGNOSIS — Z1211 Encounter for screening for malignant neoplasm of colon: Secondary | ICD-10-CM | POA: Diagnosis not present

## 2013-11-21 DIAGNOSIS — Q438 Other specified congenital malformations of intestine: Secondary | ICD-10-CM | POA: Insufficient documentation

## 2013-11-21 DIAGNOSIS — K648 Other hemorrhoids: Secondary | ICD-10-CM | POA: Insufficient documentation

## 2013-11-21 DIAGNOSIS — D126 Benign neoplasm of colon, unspecified: Secondary | ICD-10-CM | POA: Insufficient documentation

## 2013-11-21 HISTORY — PX: COLONOSCOPY: SHX5424

## 2013-11-21 SURGERY — COLONOSCOPY
Anesthesia: Moderate Sedation

## 2013-11-21 MED ORDER — SODIUM CHLORIDE 0.9 % IV SOLN
INTRAVENOUS | Status: DC
  Administered 2013-11-21: 12:00:00 via INTRAVENOUS

## 2013-11-21 MED ORDER — MEPERIDINE HCL 100 MG/ML IJ SOLN
INTRAMUSCULAR | Status: AC
  Filled 2013-11-21: qty 2

## 2013-11-21 MED ORDER — STERILE WATER FOR IRRIGATION IR SOLN
Status: DC | PRN
  Administered 2013-11-21: 13:00:00

## 2013-11-21 MED ORDER — MEPERIDINE HCL 100 MG/ML IJ SOLN
INTRAMUSCULAR | Status: DC | PRN
  Administered 2013-11-21 (×3): 25 mg via INTRAVENOUS

## 2013-11-21 MED ORDER — MIDAZOLAM HCL 5 MG/5ML IJ SOLN
INTRAMUSCULAR | Status: DC | PRN
  Administered 2013-11-21: 2 mg via INTRAVENOUS
  Administered 2013-11-21: 1 mg via INTRAVENOUS
  Administered 2013-11-21: 2 mg via INTRAVENOUS

## 2013-11-21 MED ORDER — MIDAZOLAM HCL 5 MG/5ML IJ SOLN
INTRAMUSCULAR | Status: AC
  Filled 2013-11-21: qty 10

## 2013-11-21 NOTE — Discharge Instructions (Signed)
You had 2 small polyps removed. You have internal hemorrhoids.   Next colonoscopy in 5-10 years.  FOLLOW A HIGH FIBER DIET. SEE INFO BELOW.  YOUR BIOPSY RESULTS SHOULD BE BACK IN 7 DAYS.  Colonoscopy Care After Read the instructions outlined below and refer to this sheet in the next week. These discharge instructions provide you with general information on caring for yourself after you leave the hospital. While your treatment has been planned according to the most current medical practices available, unavoidable complications occasionally occur. If you have any problems or questions after discharge, call DR. Amen Staszak, [phone].  ACTIVITY  You may resume your regular activity, but move at a slower pace for the next 24 hours.   Take frequent rest periods for the next 24 hours.   Walking will help get rid of the air and reduce the bloated feeling in your belly (abdomen).   No driving for 24 hours (because of the medicine (anesthesia) used during the test).   You may shower.   Do not sign any important legal documents or operate any machinery for 24 hours (because of the anesthesia used during the test).    NUTRITION  Drink plenty of fluids.   You may resume your normal diet as instructed by your doctor.   Begin with a light meal and progress to your normal diet. Heavy or fried foods are harder to digest and may make you feel sick to your stomach (nauseated).   Avoid alcoholic beverages for 24 hours or as instructed.    MEDICATIONS  You may resume your normal medications.   WHAT YOU CAN EXPECT TODAY  Some feelings of bloating in the abdomen.   Passage of more gas than usual.   Spotting of blood in your stool or on the toilet paper  .  IF YOU HAD POLYPS REMOVED DURING THE COLONOSCOPY:  Eat a soft diet IF YOU HAVE NAUSEA, BLOATING, ABDOMINAL PAIN, OR VOMITING.    FINDING OUT THE RESULTS OF YOUR TEST Not all test results are available during your visit. DR.  Oneida Alar WILL CALL YOU WITHIN 7 DAYS OF YOUR PROCEDUE WITH YOUR RESULTS. Do not assume everything is normal if you have not heard from DR. Graceanne Guin IN ONE WEEK, CALL HER OFFICE AT [phone].  SEEK IMMEDIATE MEDICAL ATTENTION AND CALL THE OFFICE: [phone] IF:  You have more than a spotting of blood in your stool.   Your belly is swollen (abdominal distention).   You are nauseated or vomiting.   You have a temperature over 101F.   You have abdominal pain or discomfort that is severe or gets worse throughout the day.   High-Fiber Diet A high-fiber diet changes your normal diet to include more whole grains, legumes, fruits, and vegetables. Changes in the diet involve replacing refined carbohydrates with unrefined foods. The calorie level of the diet is essentially unchanged. The Dietary Reference Intake (recommended amount) for adult males is 38 grams per day. For adult females, it is 25 grams per day. Pregnant and lactating women should consume 28 grams of fiber per day. Fiber is the intact part of a plant that is not broken down during digestion. Functional fiber is fiber that has been isolated from the plant to provide a beneficial effect in the body. PURPOSE  Increase stool bulk.   Ease and regulate bowel movements.   Lower cholesterol.  INDICATIONS THAT YOU NEED MORE FIBER  Constipation and hemorrhoids.   Uncomplicated diverticulosis (intestine condition) and irritable bowel syndrome.  Weight management.   As a protective measure against hardening of the arteries (atherosclerosis), diabetes, and cancer.   GUIDELINES FOR INCREASING FIBER IN THE DIET  Start adding fiber to the diet slowly. A gradual increase of about 5 more grams (2 slices of whole-wheat bread, 2 servings of most fruits or vegetables, or 1 bowl of high-fiber cereal) per day is best. Too rapid an increase in fiber may result in constipation, flatulence, and bloating.   Drink enough water and fluids to keep  your urine clear or pale yellow. Water, juice, or caffeine-free drinks are recommended. Not drinking enough fluid may cause constipation.   Eat a variety of high-fiber foods rather than one type of fiber.   Try to increase your intake of fiber through using high-fiber foods rather than fiber pills or supplements that contain small amounts of fiber.   The goal is to change the types of food eaten. Do not supplement your present diet with high-fiber foods, but replace foods in your present diet.  INCLUDE A VARIETY OF FIBER SOURCES  Replace refined and processed grains with whole grains, canned fruits with fresh fruits, and incorporate other fiber sources. White rice, white breads, and most bakery goods contain little or no fiber.   Brown whole-grain rice, buckwheat oats, and many fruits and vegetables are all good sources of fiber. These include: broccoli, Brussels sprouts, cabbage, cauliflower, beets, sweet potatoes, white potatoes (skin on), carrots, tomatoes, eggplant, squash, berries, fresh fruits, and dried fruits.   Cereals appear to be the richest source of fiber. Cereal fiber is found in whole grains and bran. Bran is the fiber-rich outer coat of cereal grain, which is largely removed in refining. In whole-grain cereals, the bran remains. In breakfast cereals, the largest amount of fiber is found in those with "bran" in their names. The fiber content is sometimes indicated on the label.   You may need to include additional fruits and vegetables each day.   In baking, for 1 cup white flour, you may use the following substitutions:   1 cup whole-wheat flour minus 2 tablespoons.   1/2 cup white flour plus 1/2 cup whole-wheat flour.   Polyps, Colon  A polyp is extra tissue that grows inside your body. Colon polyps grow in the large intestine. The large intestine, also called the colon, is part of your digestive system. It is a long, hollow tube at the end of your digestive tract where your  body makes and stores stool. Most polyps are not dangerous. They are benign. This means they are not cancerous. But over time, some types of polyps can turn into cancer. Polyps that are smaller than a pea are usually not harmful. But larger polyps could someday become or may already be cancerous. To be safe, doctors remove all polyps and test them.   WHO GETS POLYPS? Anyone can get polyps, but certain people are more likely than others. You may have a greater chance of getting polyps if:  You are over 50.   You have had polyps before.   Someone in your family has had polyps.   Someone in your family has had cancer of the large intestine.   Find out if someone in your family has had polyps. You may also be more likely to get polyps if you:   Eat a lot of fatty foods   Smoke   Drink alcohol   Do not exercise  Eat too much   TREATMENT  The caregiver will remove the  polyp during sigmoidoscopy or colonoscopy.    PREVENTION There is not one sure way to prevent polyps. You might be able to lower your risk of getting them if you:  Eat more fruits and vegetables and less fatty food.   Do not smoke.   Avoid alcohol.   Exercise every day.   Lose weight if you are overweight.   Eating more calcium and folate can also lower your risk of getting polyps. Some foods that are rich in calcium are milk, cheese, and broccoli. Some foods that are rich in folate are chickpeas, kidney beans, and spinach.   Hemorrhoids Hemorrhoids are dilated (enlarged) veins around the rectum. Sometimes clots will form in the veins. This makes them swollen and painful. These are called thrombosed hemorrhoids. Causes of hemorrhoids include:  Constipation.   Straining to have a bowel movement.   HEAVY LIFTING HOME CARE INSTRUCTIONS  Eat a well balanced diet and drink 6 to 8 glasses of water every day to avoid constipation. You may also use a bulk laxative.   Avoid straining to have bowel movements.    Keep anal area dry and clean.   Do not use a donut shaped pillow or sit on the toilet for long periods. This increases blood pooling and pain.   Move your bowels when your body has the urge; this will require less straining and will decrease pain and pressure.

## 2013-11-21 NOTE — Op Note (Signed)
[facility] [address] [place], [address]   COLONOSCOPY PROCEDURE REPORT  PATIENT: Neil, Errickson  MR#: [id number] BIRTHDATE: 1960-07-19 , 52  yrs. old GENDER: Female ENDOSCOPIST: Barney Drain, MD REFERRED GY:[id number] [place] PROCEDURE DATE:  11/21/2013 PROCEDURE:   Colonoscopy with snare polypectomy INDICATIONS:Average risk patient for colon cancer. MEDICATIONS: Demerol 75 mg IV and Versed 5 mg IV  DESCRIPTION OF PROCEDURE:    Physical exam was performed.  Informed consent was obtained from the patient after explaining the benefits, risks, and alternatives to procedure.  The patient was connected to monitor and placed in left lateral position. Continuous oxygen was provided by nasal cannula and IV medicine administered through an indwelling cannula.  After administration of sedation and rectal exam, the patients rectum was intubated and the EC-[id number] ([id number])  colonoscope was advanced under direct visualization to the ileum.  The scope was removed slowly by carefully examining the color, texture, anatomy, and integrity mucosa on the way out.  The patient was recovered in endoscopy and discharged home in satisfactory condition.    COLON FINDINGS: The mucosa appeared normal in the terminal ileum.  , Two sessile polyps measuring 6 mm in size were found in the sigmoid colon and rectum.  A polypectomy was performed using snare cautery. , The LEFT colon IS redundant.  Manual abdominal counter-pressure was used to reach the cecum, and Small internal hemorrhoids were found.  PREP QUALITY: excellent.CECAL W/D TIME: 21 minutes COMPLICATIONS: None  ENDOSCOPIC IMPRESSION: 1.   Normal mucosa in the terminal ileum 2.   Two COLON POLYPS REMOVED 3.   The colon IS redundant 4.   Small internal hemorrhoids  RECOMMENDATIONS: AWAIT BIOPSY HIGH FIBER DIET TCS IN 5-10 YEARS. CONSIDER OVERTUBE.      _______________________________ Lorrin MaisBarney Drain, MD  11/21/2013 1:29 PM

## 2013-11-21 NOTE — H&P (Signed)
  Primary Care Physician:  PROVIDER NOT [facility] Gastroenterologist:  Dr. Oneida Alar  Pre-Procedure History & Physical: HPI:  Andrea Tyler is a 53 y.o. female here for [place].  Past Medical History  Diagnosis Date  . Abnormal Pap smear   . Seasonal allergies 09/29/2012  . Obesity 11/04/2012  . Menopausal symptoms 11/04/2012  . Menopausal symptom 11/04/2012    Past Surgical History  Procedure Laterality Date  . Tubal ligation    . Tonsillectomy    . Cesarean section      x 2    Prior to Admission medications   Medication Sig Start Date End Date Taking? Authorizing Provider  Cetirizine HCl (ZYRTEC PO) Take 1 tablet by mouth daily.   Yes Historical Provider, MD  fluticasone (FLONASE) 50 MCG/ACT nasal spray Place 2 sprays into both nostrils daily as needed for allergies.    Yes Historical Provider, MD  Multiple Vitamin (MULTIVITAMIN) tablet Take 1 tablet by mouth daily.   Yes Historical Provider, MD  phentermine 37.5 MG capsule Take 1 capsule (37.5 mg total) by mouth every morning. 11/20/13   Estill Dooms, NP    Allergies as of 11/13/2013  . (No Known Allergies)    Family History  Problem Relation Age of Onset  . Heart disease Father   . Hypertension Father   . Diabetes Mother   . Angina Mother   . Macular degeneration Mother   . Arthritis Mother   . Cancer Brother     lung and liver  . Hypertension Brother   . Diabetes Brother   . Allergies Daughter   . Allergies Daughter   . Asthma Daughter   . Allergies Daughter   . Heart disease Paternal Grandfather   . Heart disease Paternal Grandmother   . Cancer Maternal Grandmother   . Cancer Maternal Grandfather   . Cancer Maternal Aunt     breast  . Colon cancer Neg Hx     History   Social History  . Marital Status: Divorced    Spouse Name: N/A    Number of Children: N/A  . Years of Education: N/A   Occupational History  . Not on file.   Social History Main Topics  . Smoking status:  Never Smoker   . Smokeless tobacco: Never Used  . Alcohol Use: No  . Drug Use: No  . Sexual Activity: Yes    Birth Control/ Protection: Surgical   Other Topics Concern  . Not on file   Social History Narrative  . No narrative on file    Review of Systems: See HPI, otherwise negative ROS   Physical Exam: BP 134/79  Pulse 62  Temp(Src) 98.2 F (36.8 C) (Oral)  Resp 14  Ht 5' 4.5" (1.638 m)  Wt 196 lb (88.905 kg)  BMI 33.14 kg/m2  SpO2 98% General:   Alert,  pleasant and cooperative in NAD Head:  Normocephalic and atraumatic. Neck:  Supple; Lungs:  Clear throughout to auscultation.    Heart:  Regular rate and rhythm. Abdomen:  Soft, nontender and nondistended. Normal bowel sounds, without guarding, and without rebound.   Neurologic:  Alert and  oriented x4;  grossly normal neurologically.  Impression/Plan:     SCREENING  Plan:  1. TCS TODAY

## 2013-11-23 ENCOUNTER — Encounter (HOSPITAL_COMMUNITY): Payer: Self-pay | Admitting: Gastroenterology

## 2013-11-30 ENCOUNTER — Telehealth: Payer: Self-pay | Admitting: Gastroenterology

## 2013-11-30 NOTE — Telephone Encounter (Signed)
Please call pt. She had simple adenomas removed. FOLLOW A HIGH FIBER DIET. NEXT TCS IN 5 YEARS.

## 2013-12-05 NOTE — Telephone Encounter (Signed)
Called and informed pt.  

## 2013-12-05 NOTE — Telephone Encounter (Signed)
Reminder in EPIC 

## 2013-12-11 ENCOUNTER — Other Ambulatory Visit: Payer: Self-pay | Admitting: Adult Health

## 2013-12-11 DIAGNOSIS — Z1231 Encounter for screening mammogram for malignant neoplasm of breast: Secondary | ICD-10-CM

## 2013-12-18 ENCOUNTER — Encounter: Payer: Self-pay | Admitting: Adult Health

## 2013-12-18 ENCOUNTER — Ambulatory Visit (HOSPITAL_COMMUNITY)
Admission: RE | Admit: 2013-12-18 | Discharge: 2013-12-18 | Disposition: A | Discharge status: Home / Self Care | Payer: Medicaid Other | Source: Ambulatory Visit | Attending: Adult Health | Admitting: Adult Health

## 2013-12-18 ENCOUNTER — Ambulatory Visit (INDEPENDENT_AMBULATORY_CARE_PROVIDER_SITE_OTHER): Payer: Medicaid Other | Admitting: Adult Health

## 2013-12-18 VITALS — BP 110/80 | Ht 64.5 in | Wt 190.0 lb

## 2013-12-18 DIAGNOSIS — Z1231 Encounter for screening mammogram for malignant neoplasm of breast: Secondary | ICD-10-CM | POA: Diagnosis not present

## 2013-12-18 DIAGNOSIS — E669 Obesity, unspecified: Secondary | ICD-10-CM

## 2013-12-18 MED ORDER — PHENTERMINE HCL 37.5 MG PO CAPS: 37.5000 mg | ORAL_CAPSULE | ORAL | Status: DC

## 2013-12-18 NOTE — Progress Notes (Signed)
Subjective:     Patient ID: Andrea Tyler, female   DOB: 1961-01-30, 53 y.o.   MRN: [id number]  HPI Andrea Tyler is a 53 year old white female in for wight and BP, has been using adipex to aid in weight loss and has lost over 14 lbs so far, no complaints.Had colonoscopy in June and is good for 10 years, had 2 polyps and internal hemorhoids.  Review of Systems See HPI Reviewed past medical,surgical, social and family history. Reviewed medications and allergies.     Objective:   Physical Exam BP 110/80  Ht 5' 4.5" (1.638 m)  Wt 190 lb (86.183 kg)  BMI 32.12 kg/m2   Skin warm and dry.  Lungs: clear to ausculation bilaterally. Cardiovascular: regular rate and rhythm.Wants to continue adipex.  Assessment:     Obesity Weight management    Plan:     Rx adipex 37.5 mg #30 1 daily no refills Follow up in  4 weeks

## 2013-12-18 NOTE — Patient Instructions (Signed)
Continue weight loss Follow up in 4 weeks 

## 2013-12-21 ENCOUNTER — Other Ambulatory Visit: Payer: Self-pay | Admitting: Adult Health

## 2013-12-21 DIAGNOSIS — R928 Other abnormal and inconclusive findings on diagnostic imaging of breast: Secondary | ICD-10-CM

## 2014-01-02 ENCOUNTER — Ambulatory Visit (HOSPITAL_COMMUNITY)
Admission: RE | Admit: 2014-01-02 | Discharge: 2014-01-02 | Disposition: A | Discharge status: Home / Self Care | Payer: Medicaid Other | Source: Ambulatory Visit | Attending: Adult Health | Admitting: Adult Health

## 2014-01-02 ENCOUNTER — Encounter (HOSPITAL_COMMUNITY): Payer: Medicaid Other

## 2014-01-02 DIAGNOSIS — R928 Other abnormal and inconclusive findings on diagnostic imaging of breast: Secondary | ICD-10-CM | POA: Insufficient documentation

## 2014-01-15 ENCOUNTER — Ambulatory Visit (INDEPENDENT_AMBULATORY_CARE_PROVIDER_SITE_OTHER): Payer: Medicaid Other | Admitting: Adult Health

## 2014-01-15 ENCOUNTER — Encounter: Payer: Self-pay | Admitting: Adult Health

## 2014-01-15 VITALS — BP 120/76 | Ht 64.5 in | Wt 190.0 lb

## 2014-01-15 DIAGNOSIS — E669 Obesity, unspecified: Secondary | ICD-10-CM

## 2014-01-15 MED ORDER — PHENTERMINE HCL 37.5 MG PO CAPS: 37.5000 mg | ORAL_CAPSULE | ORAL | Status: DC

## 2014-01-15 NOTE — Patient Instructions (Signed)
Continue weight loss efforts,this will be last rx, take a break for at least 3 months

## 2014-01-15 NOTE — Progress Notes (Signed)
Subjective:     Patient ID: Andrea Tyler, female   DOB: Nov 08, 1960, 53 y.o.   MRN: [id number]  HPI Andrea Tyler is a 54 year old white female in for weight and BP check,has lost 14 lbs and this last month did not lose any.Has been busy with parents who live in [place].  Review of Systems See HPI Reviewed past medical,surgical, social and family history. Reviewed medications and allergies.     Objective:   Physical Exam BP 120/76  Ht 5' 4.5" (1.638 m)  Wt 190 lb (86.183 kg)  BMI 32.12 kg/m2   Skin warm and dry.  Lungs: clear to ausculation bilaterally. Cardiovascular: regular rate and rhythm. Assessment:     Obesity     Plan:     Rx adipex 37.5 mg #30 1 daily no refills Follow up prn Continue weight loss efforts, this is last rx for a while take at least 3 months off

## 2014-04-16 ENCOUNTER — Encounter: Payer: Self-pay | Admitting: Adult Health

## 2014-07-04 ENCOUNTER — Other Ambulatory Visit (HOSPITAL_COMMUNITY): Payer: Self-pay | Admitting: *Deleted

## 2014-07-04 DIAGNOSIS — C4921 Malignant neoplasm of connective and soft tissue of right lower limb, including hip: Secondary | ICD-10-CM

## 2014-07-10 ENCOUNTER — Ambulatory Visit (HOSPITAL_COMMUNITY)
Admission: RE | Admit: 2014-07-10 | Discharge: 2014-07-10 | Disposition: A | Discharge status: Home / Self Care | Payer: Medicaid Other | Source: Ambulatory Visit | Attending: *Deleted | Admitting: *Deleted

## 2014-07-10 DIAGNOSIS — C4921 Malignant neoplasm of connective and soft tissue of right lower limb, including hip: Secondary | ICD-10-CM | POA: Insufficient documentation

## 2014-12-28 ENCOUNTER — Other Ambulatory Visit: Payer: Self-pay | Admitting: Obstetrics and Gynecology

## 2014-12-28 DIAGNOSIS — Z1231 Encounter for screening mammogram for malignant neoplasm of breast: Secondary | ICD-10-CM

## 2014-12-31 ENCOUNTER — Ambulatory Visit (HOSPITAL_COMMUNITY)
Admission: RE | Admit: 2014-12-31 | Discharge: 2014-12-31 | Disposition: A | Discharge status: Home / Self Care | Payer: Medicaid Other | Source: Ambulatory Visit | Attending: Obstetrics and Gynecology | Admitting: Obstetrics and Gynecology

## 2014-12-31 DIAGNOSIS — Z1231 Encounter for screening mammogram for malignant neoplasm of breast: Secondary | ICD-10-CM | POA: Diagnosis not present

## 2015-04-08 ENCOUNTER — Ambulatory Visit (INDEPENDENT_AMBULATORY_CARE_PROVIDER_SITE_OTHER): Payer: Medicaid Other | Admitting: Adult Health

## 2015-04-08 ENCOUNTER — Encounter: Payer: Self-pay | Admitting: Adult Health

## 2015-04-08 VITALS — BP 110/78 | HR 64 | Ht 64.5 in | Wt 202.5 lb

## 2015-04-08 DIAGNOSIS — Z1212 Encounter for screening for malignant neoplasm of rectum: Secondary | ICD-10-CM | POA: Diagnosis not present

## 2015-04-08 DIAGNOSIS — Z0000 Encounter for general adult medical examination without abnormal findings: Secondary | ICD-10-CM

## 2015-04-08 DIAGNOSIS — N950 Postmenopausal bleeding: Secondary | ICD-10-CM

## 2015-04-08 DIAGNOSIS — Z01419 Encounter for gynecological examination (general) (routine) without abnormal findings: Secondary | ICD-10-CM

## 2015-04-08 HISTORY — DX: Postmenopausal bleeding: N95.0

## 2015-04-08 LAB — HEMOCCULT GUIAC POC 1CARD (OFFICE): FECAL OCCULT BLD: NEGATIVE

## 2015-04-08 NOTE — Patient Instructions (Signed)
Physical in 1 year Mammogram yearly Return in 1 week for [place]

## 2015-04-08 NOTE — Progress Notes (Signed)
Patient ID: Andrea Tyler, female   DOB: Mar 27, 1961, 54 y.o.   MRN: [id number] History of Present Illness: Andrea Tyler is a 54 year old white female,divorced in for a well woman gyn exam,she had a normal pap with negative HPV.She says last month she had vaginal bleeding and cramps,had not had period for about 5 years,she thinks.She has been having hot flashes,but her mom is staying with her.   Current Medications, Allergies, Past Medical History, Past Surgical History, Family History and Social History were reviewed in [employer] record.     Review of Systems: Patient denies any headaches, hearing loss, fatigue, blurred vision, shortness of breath, chest pain, abdominal pain, problems with bowel movements, urination, or intercourse. No joint pain or mood swings. See HPI for positives.    Physical Exam:BP 110/78 mmHg  Pulse 64  Ht 5' 4.5" (1.638 m)  Wt 202 lb 8 oz (91.853 kg)  BMI 34.23 kg/m2  General:  Well developed, well nourished, no acute distress Skin:  Warm and dry Neck:  Midline trachea, normal thyroid, good ROM, no lymphadenopathy Lungs; Clear to auscultation bilaterally Breast:  No dominant palpable mass, retraction, or nipple discharge Cardiovascular: Regular rate and rhythm Abdomen:  Soft, non tender, no hepatosplenomegaly Pelvic:  External genitalia is normal in appearance, no lesions.  The vagina is normal in appearance. Urethra has no lesions or masses. The cervix is bulbous.  Uterus is felt to be normal size, shape, and contour.  No adnexal masses or tenderness noted.Bladder is non tender, no masses felt. Rectal: Good sphincter tone, no polyps, or hemorrhoids felt.  Hemoccult negative. Extremities/musculoskeletal:  No swelling or varicosities noted, no clubbing or cyanosis Psych:  No mood changes, alert and cooperative,seems happy   Impression: Well woman gyn exam no pap PMB    Plan: Check [facility] Return in 1 week for GYN [place] Physical in 1  year Mammogram yearly Colonoscopy per Dr Oneida Alar

## 2015-04-09 ENCOUNTER — Telehealth: Payer: Self-pay | Admitting: Adult Health

## 2015-04-09 LAB — FOLLICLE STIMULATING HORMONE: FSH: 91.5 m[IU]/mL

## 2015-04-09 NOTE — Telephone Encounter (Signed)
Left message [place] 91.5 and get [place] 10/31 as scheduled

## 2015-04-15 ENCOUNTER — Ambulatory Visit (INDEPENDENT_AMBULATORY_CARE_PROVIDER_SITE_OTHER): Payer: Medicaid Other

## 2015-04-15 DIAGNOSIS — N950 Postmenopausal bleeding: Secondary | ICD-10-CM | POA: Diagnosis not present

## 2015-04-15 NOTE — Progress Notes (Signed)
PELVIC US TA/TV:heterogenous anteverted uterus,no fibroids seen,thick EEC 68mm,normal rt ov,1.6 x 1.4 x 1.3cm simple cyst lt ov,no free fluid seen,ov's appear to be mobile,no pain during ultrasound

## 2015-04-16 ENCOUNTER — Telehealth: Payer: Self-pay | Admitting: Adult Health

## 2015-04-16 NOTE — Telephone Encounter (Signed)
Left message to call me about US,will need endo biopsy for thickened endometrium.

## 2015-04-16 NOTE — Telephone Encounter (Signed)
Pt aware that US shows thickened endometrium of 13 mm will get end biopsy scheduled with MD,she also had simple cyst on left.

## 2015-04-23 ENCOUNTER — Other Ambulatory Visit: Payer: Medicaid Other | Admitting: Obstetrics & Gynecology

## 2015-04-24 ENCOUNTER — Encounter: Payer: Self-pay | Admitting: Obstetrics & Gynecology

## 2015-04-24 ENCOUNTER — Ambulatory Visit (INDEPENDENT_AMBULATORY_CARE_PROVIDER_SITE_OTHER): Payer: Medicaid Other | Admitting: Obstetrics & Gynecology

## 2015-04-24 ENCOUNTER — Other Ambulatory Visit: Payer: Self-pay | Admitting: Obstetrics & Gynecology

## 2015-04-24 VITALS — BP 120/70 | HR 66 | Ht 64.5 in | Wt 204.0 lb

## 2015-04-24 DIAGNOSIS — N840 Polyp of corpus uteri: Secondary | ICD-10-CM

## 2015-04-24 DIAGNOSIS — R9389 Abnormal findings on diagnostic imaging of other specified body structures: Secondary | ICD-10-CM

## 2015-04-24 DIAGNOSIS — N950 Postmenopausal bleeding: Secondary | ICD-10-CM

## 2015-04-24 NOTE — Progress Notes (Signed)
Patient ID: Andrea Tyler, female   DOB: 02-May-1961, 54 y.o.   MRN: [id number]  Endometrial Biopsy Procedure Note  Pre-operative Diagnosis: Thickened endometrial biopsy  Post-operative Diagnosis: same  Indications: abnormal uterine bleeding and thickened endometrial stripe  Procedure Details   Urine pregnancy test was not done.  The risks (including infection, bleeding, pain, and uterine perforation) and benefits of the procedure were explained to the patient and Written informed consent was obtained.  Antibiotic prophylaxis against endocarditis was not indicated.   The patient was placed in the dorsal lithotomy position.  Bimanual exam showed the uterus to be in the anteroflexed position.  A Graves' speculum inserted in the vagina, and the cervix prepped with povidone iodine.  Endocervical curettage with a Kevorkian curette was not performed.   A sharp tenaculum was applied to the anterior lip of the cervix for stabilization.  A sterile uterine sound was used to sound the uterus to a depth of 6cm.  A Mylex 66mm curette was used to sample the endometrium.  Sample was sent for pathologic examination.  Condition: Stable  Complications: None  Plan:  The patient was advised to call for any fever or for prolonged or severe pain or bleeding. She was advised to use OTC ibuprofen as needed for mild to moderate pain. She was advised to avoid vaginal intercourse for 48 hours or until the bleeding has completely stopped.  Attending Physician Documentation: I was present for or performed the following: endometrial biopsy

## 2015-04-25 NOTE — Addendum Note (Signed)
Addended by: Doyne Keel on: 04/25/2015 08:56 AM   Modules accepted: Orders

## 2015-05-01 ENCOUNTER — Encounter: Payer: Self-pay | Admitting: Obstetrics & Gynecology

## 2015-05-01 ENCOUNTER — Ambulatory Visit (INDEPENDENT_AMBULATORY_CARE_PROVIDER_SITE_OTHER): Payer: Medicaid Other | Admitting: Obstetrics & Gynecology

## 2015-05-01 VITALS — BP 114/80 | HR 72 | Wt 204.3 lb

## 2015-05-01 DIAGNOSIS — N950 Postmenopausal bleeding: Secondary | ICD-10-CM

## 2015-05-01 DIAGNOSIS — N840 Polyp of corpus uteri: Secondary | ICD-10-CM | POA: Diagnosis not present

## 2015-05-01 DIAGNOSIS — N951 Menopausal and female climacteric states: Secondary | ICD-10-CM

## 2015-05-01 MED ORDER — PROGESTERONE MICRONIZED 200 MG PO CAPS: 200.0000 mg | ORAL_CAPSULE | Freq: Every day | ORAL | Status: AC

## 2015-05-01 MED ORDER — ESTRADIOL 1 MG/GM TD GEL: 1.0000 | Freq: Every day | TRANSDERMAL | Status: AC

## 2015-05-01 NOTE — Progress Notes (Signed)
Patient ID: Andrea Tyler, female   DOB: 1961/04/18, 54 y.o.   MRN: [id number]   Follow up appointment for results  Chief Complaint  Patient presents with  . Follow-up    biopsy result.    Blood pressure 114/80, pulse 72, weight 204 lb 4.8 oz (92.67 kg).  Biopsy report is benign with a endometrial polyp Having a lot of vasomotor symptoms  Talked for quite a while regarding her symptoms and wants to ry some estrogen after questions were answered  MEDS ordered this encounter: Meds ordered this encounter  Medications  . Estradiol (DIVIGEL) 1 MG/GM GEL    Sig: Place 1 packet onto the skin daily at 6 (six) AM.    Dispense:  1 g    Refill:  30  . progesterone (PROMETRIUM) 200 MG capsule    Sig: Take 1 capsule (200 mg total) by mouth daily.    Dispense:  30 capsule    Refill:  11    Orders for this encounter: No orders of the defined types were placed in this encounter.    Plan: Wants to wait on any further therapy for her polyp since it is benign  Follow Up: 3 months to see how she is doing on her HRT  Face to face time:  15 minutes  Greater than 50% of the visit time was spent in counseling and coordination of care with the patient.  The summary and outline of the counseling and care coordination is summarized in the note above.   All questions were answered.  Past Medical History  Diagnosis Date  . Abnormal Pap smear   . Seasonal allergies 09/29/2012  . Obesity 11/04/2012  . Menopausal symptoms 11/04/2012  . Menopausal symptom 11/04/2012  . Vaginal Pap smear, abnormal   . PMB (postmenopausal bleeding) 04/08/2015    Past Surgical History  Procedure Laterality Date  . Tubal ligation    . Tonsillectomy    . Colonoscopy N/A 11/21/2013    Procedure: COLONOSCOPY;  Surgeon: Danie Binder, MD;  Location: [facility];  Service: Endoscopy;  Laterality: N/A;  11:30 AM-moved to [address] to notify pt  . Cesarean section      x 2    OB History    Gravida Para Term  Preterm AB TAB SAB Ectopic Multiple Living   3 3        3       No Known Allergies  Social History   Social History  . Marital Status: Divorced    Spouse Name: N/A  . Number of Children: N/A  . Years of Education: N/A   Social History Main Topics  . Smoking status: Never Smoker   . Smokeless tobacco: Never Used  . Alcohol Use: Yes     Comment: occ  . Drug Use: No  . Sexual Activity: Yes    Birth Control/ Protection: Surgical     Comment: tubal   Other Topics Concern  . None   Social History Narrative    Family History  Problem Relation Age of Onset  . Heart disease Father   . Hypertension Father   . Pneumonia Father   . Diabetes Mother   . Angina Mother   . Macular degeneration Mother   . Arthritis Mother   . Cancer Brother     lung and liver  . Hypertension Brother   . Diabetes Brother   . Gout Brother   . Allergies Daughter   . Allergies Daughter   .  Asthma Daughter   . Allergies Daughter   . Depression Daughter   . Anxiety disorder Daughter   . Heart disease Paternal Grandfather   . Heart disease Paternal Grandmother   . Cancer Maternal Grandmother   . Cancer Maternal Grandfather   . Cancer Maternal Aunt     breast  . Colon cancer Neg Hx

## 2015-05-02 ENCOUNTER — Telehealth: Payer: Self-pay | Admitting: Obstetrics & Gynecology

## 2015-05-06 ENCOUNTER — Encounter: Payer: Self-pay | Admitting: *Deleted

## 2015-05-06 ENCOUNTER — Telehealth: Payer: Self-pay | Admitting: *Deleted

## 2015-05-06 NOTE — Telephone Encounter (Signed)
Pt informed PA for Divigel completed this am, should hear back within 3-5 business days. Pt verbalized understanding.

## 2015-05-06 NOTE — Progress Notes (Signed)
Patient ID: Andrea Tyler, female   DOB: 1960/10/19, 54 y.o.   MRN: [id number] Completed Prior Authorization on [email or web address]. Notified should received fax 3-5 business days of results.

## 2015-05-08 ENCOUNTER — Other Ambulatory Visit: Payer: Self-pay | Admitting: Obstetrics & Gynecology

## 2015-05-17 ENCOUNTER — Telehealth: Payer: Self-pay | Admitting: Obstetrics & Gynecology

## 2015-05-17 ENCOUNTER — Telehealth: Payer: Self-pay | Admitting: *Deleted

## 2015-05-17 MED ORDER — ESTRADIOL 0.1 MG/24HR TD PTTW: 1.0000 | MEDICATED_PATCH | TRANSDERMAL | Status: AC

## 2015-05-17 NOTE — Telephone Encounter (Signed)
Pt insurance will not cover Divigel, alternative meds include Vivelle-Dot patch, Pramarin tablet, Estradiol patch, Estrace tablet, Evamist spray, Enjuvia tablet, CombiPatch. Please advise.

## 2015-05-20 NOTE — Telephone Encounter (Signed)
Pt informed Vivelle-Dot e-scribed.

## 2017-04-28 IMAGING — MG MM SCREENING BREAST TOMO BILATERAL
4 series · 4 of 12 positions shown · non-contrast
Comparison: Previous exam(s).

CLINICAL DATA: Screening.

EXAM:
DIGITAL SCREENING BILATERAL MAMMOGRAM WITH 3D TOMO WITH CAD

[L CC]
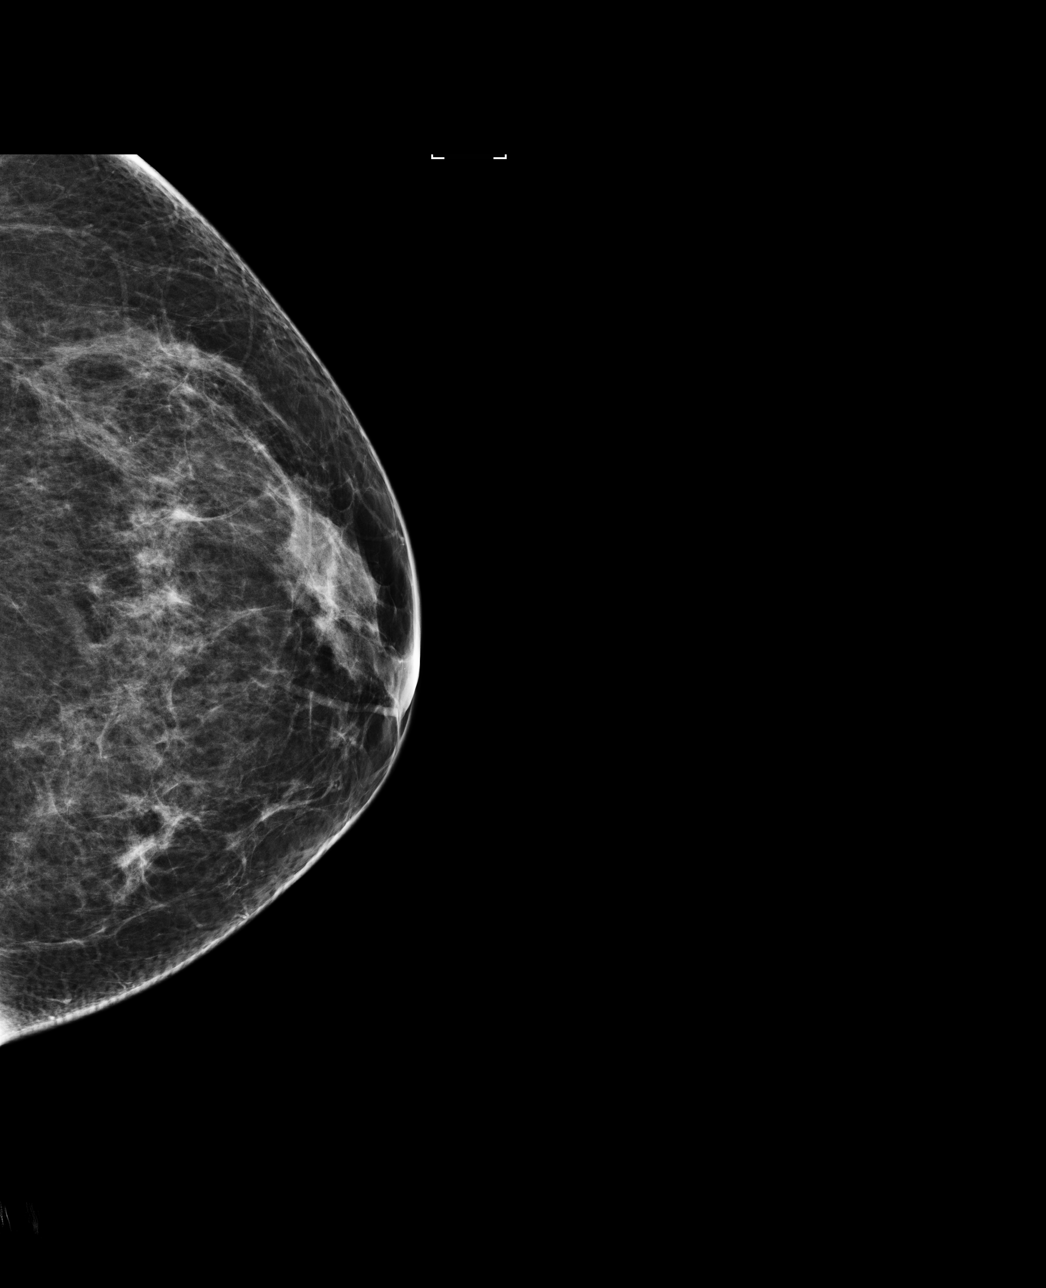

[R MLO]
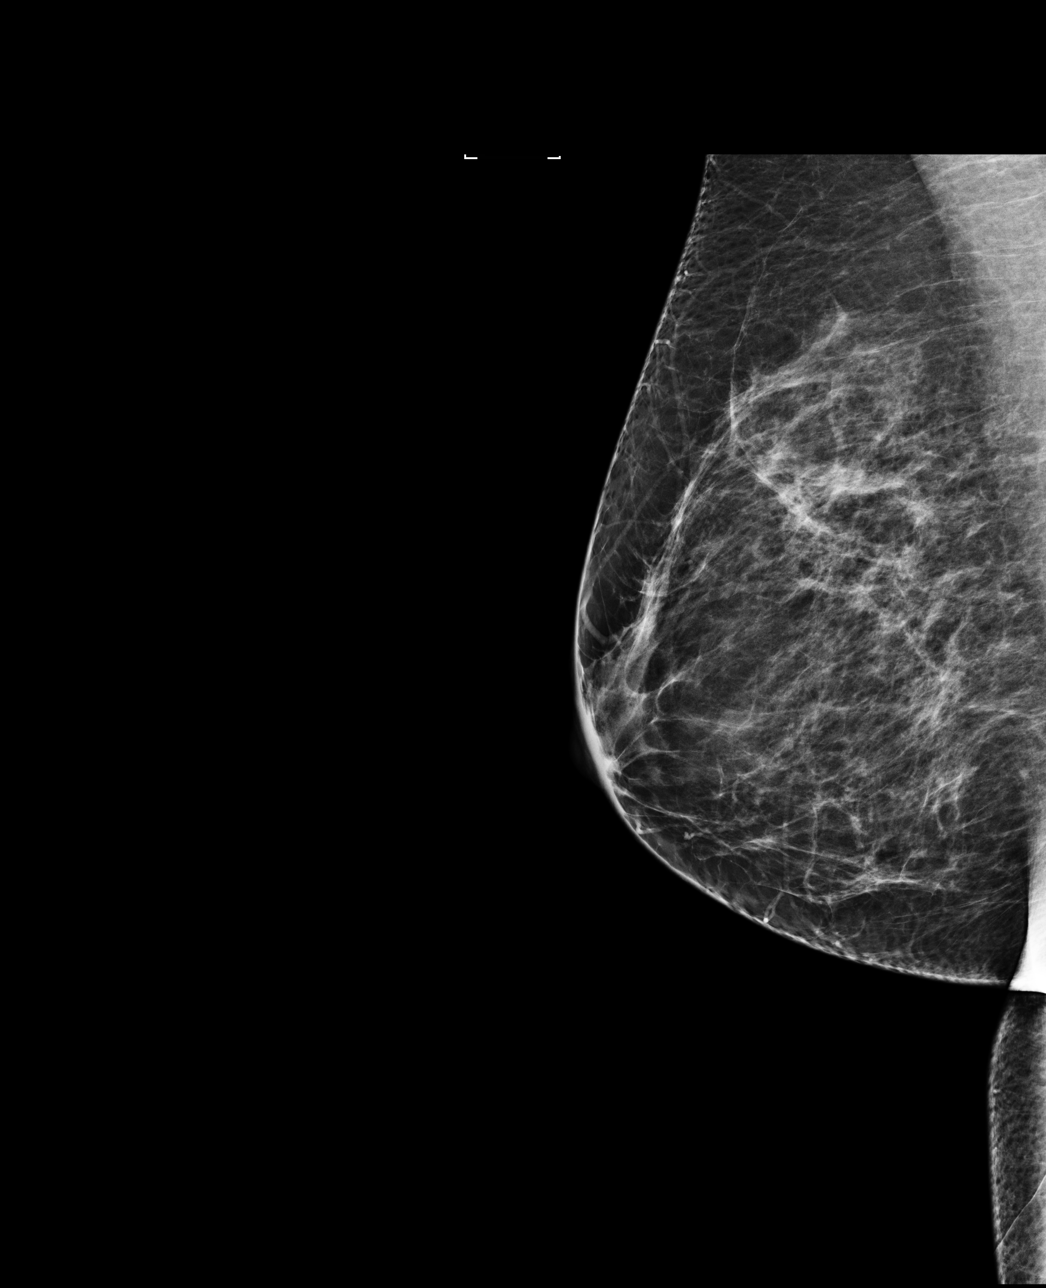

[L MLO tomo · tomo slice 39/78.0]
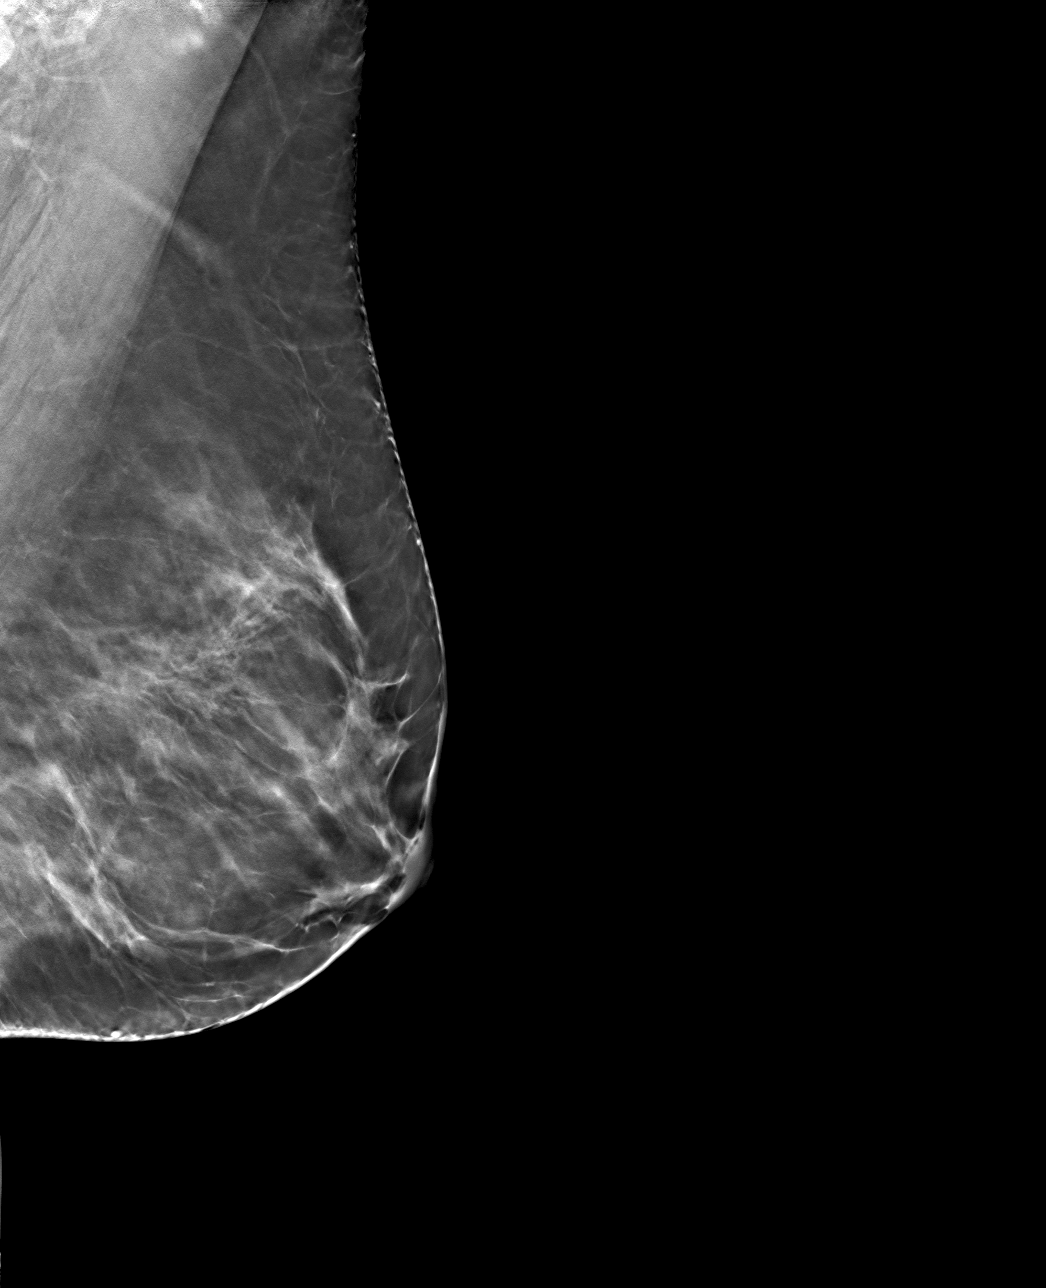

[L CC tomo · tomo slice 39/78.0]
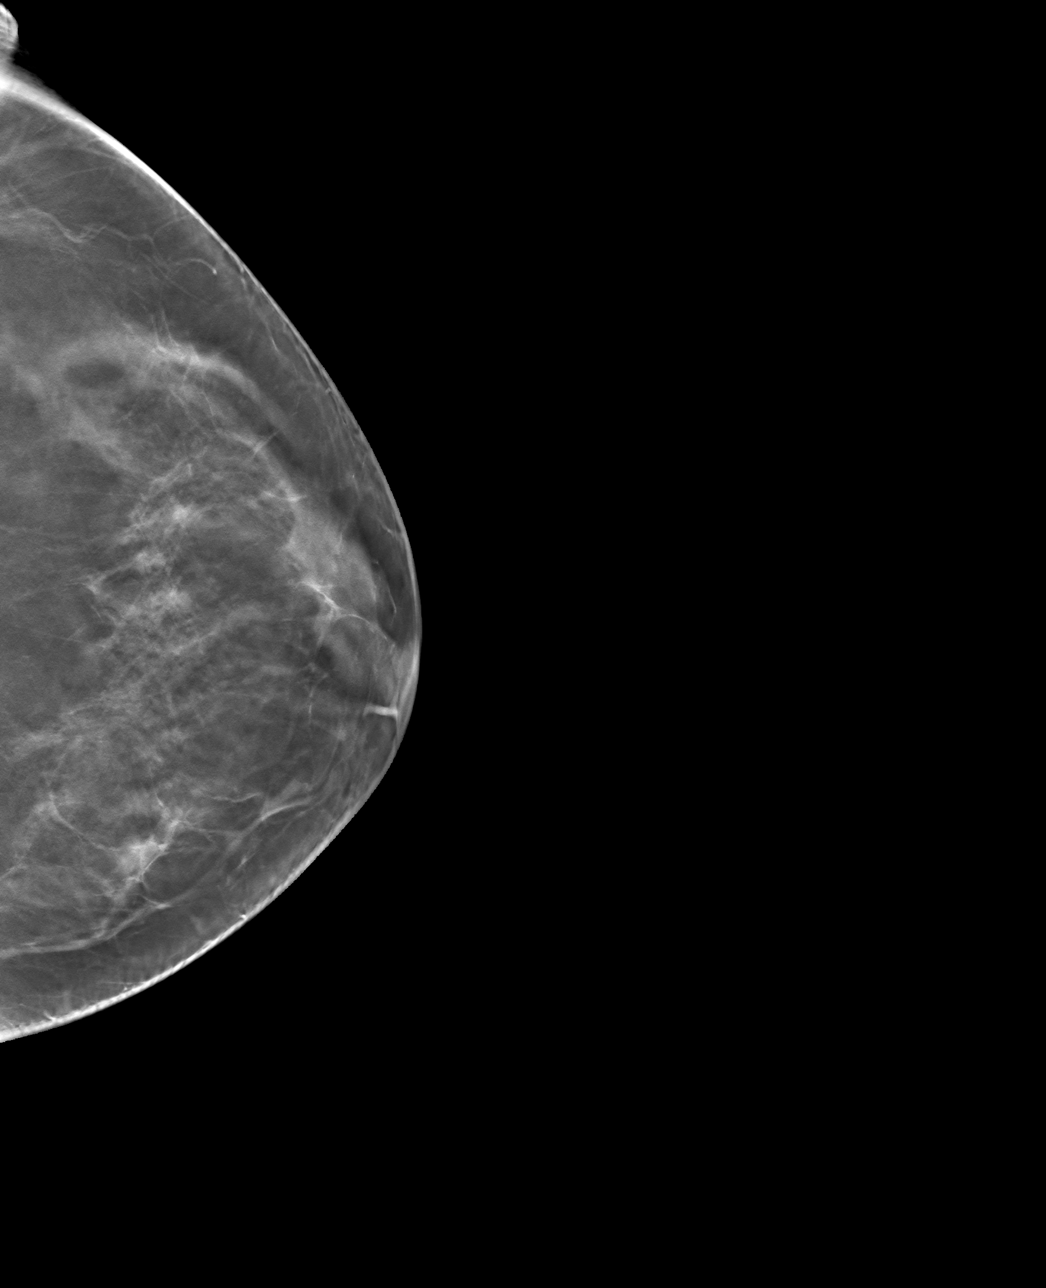

[4 of 12 positions shown; findings below may reference images not displayed]

ACR Breast Density Category b: There are scattered areas of
fibroglandular density.
FINDINGS: There are no findings suspicious for malignancy. Images were
processed with CAD.
IMPRESSION: No mammographic evidence of malignancy. A result letter of this
screening mammogram will be mailed directly to the patient.

RECOMMENDATION:
Screening mammogram in one year. (Code:55-L-23V)

BI-RADS CATEGORY  1: Negative.

## 2018-11-21 ENCOUNTER — Encounter: Payer: Self-pay | Admitting: Gastroenterology
